# Patient Record
Sex: Male | Born: 1990 | Race: Black or African American | Hispanic: No | Marital: Single
Health system: Midwestern US, Community
[De-identification: ages and names within clinical notes are randomized; demographics above are authoritative.]

## PROBLEM LIST (undated history)

## (undated) DIAGNOSIS — M79602 Pain in left arm: Principal | ICD-10-CM

---
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
## 2010-02-28 ENCOUNTER — Emergency Department (HOSPITAL_COMMUNITY)
Admission: EM | Admit: 2010-02-28 | Discharge: 2010-02-28 | Payer: Self-pay | Source: Home / Self Care | Admitting: Family Medicine

## 2011-04-08 NOTE — ED Provider Notes (Signed)
MEDICATION ADMINISTRATION SUMMARY              Drug Name: Zofran, Dose Ordered: 4 mg, Route: IV Push, Status: Given,         Time: 18:37 04/08/2011,          Drug Name: Morphine Sulfate, Dose Ordered: 6 mg, Route: IV Push,         Status: Given, Time: 18:35 04/08/2011, Detailed record available in         Medication Service section.       KNOWN ALLERGIES   NKDA: Source: Patient       TRIAGE (Tue Apr 08, 2011 15:39 LDD0)   PATIENT: NAME: Benjamin Cannon, AGE: 73, GENDER: male, DOB: Sun         08-02-1990, TIME OF GREET: Tue Apr 08, 2011 15:35, [place]: [id number],         MEDICAL RECORD NUMBER: [id number], ACCOUNT NUMBER: [id number], PCP: Pt         Denies,. (Tue Apr 08, 2011 15:39 LDD0)   ADMISSION: URGENCY: 3, TRANSPORT: Ambulatory, DEPT: Emergency,         BED: WAITING. (Tue Apr 08, 2011 15:39 LDD0)   COMPLAINT:  Abd Pain LLQ. (16:01 LDD0)   PRESENTING COMPLAINT:  llq pain. (17:19 MTM1)   TB SCREENING: TB screen not applicable for this patient. (17:19         MTM1)   ABUSE SCREENING: Not Applicable. (17:19 MTM1)   FALL RISK: Fall risk assessment not applicable to this patient.         (17:19 MTM1)   SUICIDAL IDEATION: Not Applicable. (17:19 MTM1)   ADVANCE DIRECTIVES: Unknown if patient has advance directives.         (17:19 MTM1)   PROVIDERS: TRIAGE NURSE: Annamary Carolin, RN. (Tue Apr 08, 2011 15:39         LDD0)   PREVIOUS VISIT ALLERGIES: Nkda. (Tue Apr 08, 2011 15:39         LDD0)       PRESENTING PROBLEM (15:39 LDD0)      Presenting problems: Abdominal Pain.       CURRENT MEDICATIONS (16:01 LDD0)   Patient not taking meds       MEDICATION SERVICE   Morphine Sulfate:  Order: Morphine Sulfate - Dose: 6 mg         : IV Push         Ordered by: Mikal Plane, PA-C         Entered by: Mikal Plane, PA-C Tue Apr 08, 2011 17:57 ,          Acknowledged by: Montey Hora Julious Payer) Morido, RN Tue Apr 08, 2011 18:02         Documented as given by: Minta Balsam, RN, BSN Tue Apr 08, 2011         18:35           Patient, Medication, Dose, Route and Time verified prior to         administration.          Amount given: 6 mg, IV SITE #1 into left hand, IV SITE #1 IVP,         initial medication, Slowly, Connections checked prior to         administration, Line traced prior to administration, Catheter         placement confirmed via flush prior to administration, IV site         without signs or symptoms  of infiltration during medication         administration, No swelling during administration, No drainage during         administration, IV flushed after administration, Correct patient,         time, route, dose and medication confirmed prior to administration,         Patient advised of actions and side-effects prior to administration,         Allergies confirmed and medications reviewed prior to administration,         Patient in position of comfort, Side rails up, Cart in lowest         position, Family at bedside, Call light in reach.   Zofran:  Order: Zofran (Ondansetron Hydrochloride) -         Dose: 4 mg : IV Push         Ordered by: Mikal Plane, PA-C         Entered by: Mikal Plane, PA-C Tue Apr 08, 2011 17:57 ,          Acknowledged by: Montey Hora Julious Payer) Morido, RN Tue Apr 08, 2011 18:02         Documented as given by: Minta Balsam, RN, BSN Tue Apr 08, 2011         18:37          Patient, Medication, Dose, Route and Time verified prior to         administration.          Amount given: 4 mg, IV SITE #1 into left hand, IV SITE #1 IVP,         subsequent different medication, Slowly, Connections checked prior to         administration, Line traced prior to administration, Catheter         placement confirmed via flush prior to administration, IV site         without signs or symptoms of infiltration during medication         administration, No swelling during administration, No drainage during         administration, IV flushed after administration, Correct patient,          time, route, dose and medication confirmed prior to administration,         Patient advised of actions and side-effects prior to administration,         Allergies confirmed and medications reviewed prior to administration,         Patient in position of comfort, Side rails up, Cart in lowest         position, Call light in reach, pain-7.       ORDERS   BASIC METABOLIC PANEL:  Ordered for: Sherlon Handing, M.D., Christiane Ha         Status: Active. (17:57 JOHO)   Urine dip (send for lab U/A if positive):  Ordered for: Sherlon Handing,         M.D., Christiane Ha         Status: Done by Loralie Champagne, Mardecia Tue Apr 08, 2011 19:52.         (17:57 [facility])   IV- Saline Lock:  Ordered for: Sherlon Handing, M.D., Christiane Ha         Status: Done by Lindie Spruce, LPN, Luvenia Redden Apr 08, 2011 18:36. (17:57         [facility])   CT ABD &amp; PELVIS W/O CONTRAST:  Ordered for: Sherlon Handing, M.D.,         Christiane Ha  Status: Active. (18:01 JOHO)   ( blank )urine results please:  Ordered for: Sherlon Handing, M.D.,         Christiane Ha         Status: Cancelled by Lindie Spruce, LPN, Luvenia Redden Apr 08, 2011 19:38.         (19:19 JOHO)   ( blank )po fluids:  Ordered for: Sherlon Handing, M.D., Christiane Ha         Status: Done by Lindie Spruce, LPN, Luvenia Redden Apr 08, 2011 19:38. (19:34         [facility])   URINALYSIS:  Ordered for: Sherlon Handing, M.D., Christiane Ha         Status: Done by System Tue Apr 08, 2011 20:25. (19:55 NLW0)   URINE CULTURE:  Ordered for: Sherlon Handing, M.D., Christiane Ha         Status: Active. (20:43 JOHO)   Elita Boone IV Cath:  Ordered for: Sherlon Handing, M.D., Christiane Ha         Status: Active. (21:12 NLW0)   IV Start kit:  Ordered for: Sherlon Handing, M.D., Christiane Ha         Status: Active. (21:12 NLW0)       NURSING ASSESSMENT: ABDOMEN (17:19 MTM1)   CONSTITUTIONAL: Patient arrives ambulatory, Gait steady, Patient         cooperative, Patient alert, Oriented to person, place and time, Skin         warm, Skin dry, Skin normal in color, Mucous membranes pink, Mucous         membranes moist, Patient is well-groomed.    PAIN: aching pain, to the left lower quadrant, on a scale 0-10         patient rates pain as 6.   ABDOMEN: Abdomen assessment findings include abdomen symmetrical,         Abdomen soft, Bowel sound normal, no associated nausea, no associated         vomiting, no associated diarrhea, no associated constipation.   GENITOURINARY MALE: no associated urinary complaints.       NURSING PROCEDURE: DISCHARGE NOTE (21:12 NLW0)   DISCHARGE: Patient discharged to home, ambulating without         assistance, family driving, accompanied by husband/wife/partner,         Summary of Care printed/ provided, Discharge instructions given to         patient, Simple or moderate discharge teaching performed, by md,         Prescriptions given and instructions on side effects given, Name of         prescription(s) given: levaquin vicodin, Above person(s) verbalized         understanding of discharge instructions and follow-up care, Patient         treated and evaluated by physician, IV discontinued at 2110.   SAFETY: Side rails up, Cart/Stretcher in lowest position, Family         at bedside, Call light within reach, Hospital ID band on.       NURSING PROCEDURE: IV (18:36 NLW0)   PATIENT IDENITIFIER: Patient's identity verified by patient         stating name, Patient's identity verified by patient stating birth         date, Patient's identity verified by hospital ID bracelet.   IV SITE 1: IV established, to the left hand, using a 20 gauge         catheter, in one attempt, Saline lock established, Flushed with         normal saline (mls): 10,  Tourniquet removed from patient after         procedure., Notes: DONE BY RICA RN.   FOLLOW-UP SITE 1: After procedure, sterile transparent dressing         applied.   SAFETY: Side rails up, Cart/Stretcher in lowest position, Family         at bedside, Call light within reach, Hospital ID band on.       NURSING PROCEDURE: LAB DRAW   PATIENT IDENTIFIER: Patient's identity verified by patient          stating name, Patient's identity verified by patient stating birth         date, Patient's identity verified by hospital ID bracelet. (18:35         NLW0)     Patient's identity verified by patient stating name, Patient's identity         verified by patient stating birth date, Patient's identity verified         by hospital ID bracelet, Patient actively involved in identification         process. (19:14 JHL6)   LAB DRAW: Lab draw indicated for inability to obtain labs from IV         site, Subsequent lab draw performed, by venipuncture, from right         hand, in one attempt, Labs were drawn at 1830, Lab specimens labeled         in the presence of the patient and sent to lab, Tourniquet removed         from patient after procedure. (18:35 NLW0)     Lab draw indicated for obtaining specimens for evaluation, Initial lab         draw performed, by venipuncture, from right forearm, in one attempt,         Labs were drawn at 1910, Lab specimens labeled in the presence of the         patient and sent to lab, Tourniquet removed from patient after         procedure. (19:14 JHL6)   FOLLOW-UP: After procedure, dressing applied to site. (18:35         NLW0)     After procedure, dressing applied to site, After procedure, no swelling         at site, After procedure, no active bleeding from site. (19:14         JHL6)   SAFETY: Side rails up, Cart/Stretcher in lowest position, Family         at bedside, Call light within reach, Hospital ID band on. (18:35         NLW0)     Side rails up, Cart/Stretcher in lowest position, Call light within         reach, Hospital ID band on. (19:14 JHL6)       NURSING PROCEDURE: NURSE NOTES   NURSES NOTES: Notes: PT STATES HE WENT TO BATHROOM PRIOR TO CT         AND FORGOT TO GET URINE, STILL AWAITING PT TO URINATE AGAIN. (19:33         NLW0)     Patient is awaiting results, Patient is awaiting disposition. (20:30         NLW0)       NURSING PROCEDURE: TRANSPORT TO TESTS    PATIENT IDENTIFIER: Patient's identity verified by patient         stating name, Patient's identity verified by patient stating birth  date, Patient's identity verified by hospital ID bracelet. (18:41         DNW)   TRANSPORT TO TESTS: Patient transported to CT scan, via cart,         Hand-off report received from nicole. (18:41 DNW)     Patient transported to CT scan, via wheelchair, Accompanied by x-ray         technician. (18:43 CL8)       DIAGNOSIS (20:45 JOHO)   FINAL: PRIMARY: acute left lower quadrant abdominal pain,         ADDITIONAL: acute uti.       DISPOSITION   PATIENT:  Disposition Type: Discharged, Disposition: Discharge,         Condition: Stable. (20:45 [facility])      Patient left the department. (21:13 NLW0)       VITAL SIGNS   VITAL SIGNS: Pulse: 60, Resp: 20, Temp: 98.8 (Oral), Pain: 7, O2         sat: 100 on Room air, Time: 04/08/2011 15:59. (15:59 LDD0)     BP: 124/65 (Sitting), Time: 04/08/2011 16:01. (16:01 LDD0)     BP: 145/80 (Sitting), Pulse: 76, Resp: 18, Pain: 0, O2 sat: 100 on Room         air, Time: 04/08/2011 20:35. (20:35 NLW0)     BP: 140/81 (Sitting), Pulse: 78, Resp: 18, O2 sat: 99 on Room air, Time:         04/08/2011 18:05. (18:05 MTM1)       INSTRUCTION (20:48 JOHO)   DISCHARGE:  ABDOMINAL PAIN (CGH), URINARY TRACT INFECTION         (BLADDER INFECTION, UTI, CYSTITIS).   FOLLOWUPErnst Spell, GMD, [address], [place]         [address], [phone].   SPECIAL:  -rest         -increase fluid intake         -please call your doctor or Dr. Girtha Hake in am to schedule follow-up         appt for further eval of abdominal pain         -urine has been sent for culture.       PRESCRIPTION (20:46 JOHO)   Levaquin:  Tablet : 500 Mg : Oral : Quantity: *** 1 *** Unit:         tab(s) Route: Oral Schedule: once a day Dispense: *** 7 ***.   NOTES:  No refills          May use generic.   Vicodin:  Tablet : 500 mg-5 mg : Oral : Quantity: *** 1 *** Unit:          tab Route: Oral Schedule: Q6HPRN Dispense: *** 12 ***.   NOTES:  No refills          May use generic.   Key:     CL8=Lualhati, RN, BSN, [employer]  DNW=White, [employer], Deloris ([place])      JHL6=Holmes, ACT III, Marylu Lund     JOHO=Hubbard, PA-C, Amil Amen  LDD0=David, RN, Lyn  MTM1=Morido, RN, Melrico     (Rico)     NLW0=Wyatt, LPN, Joni Reining

## 2012-02-21 ENCOUNTER — Emergency Department (HOSPITAL_COMMUNITY): Payer: No Typology Code available for payment source

## 2012-02-21 ENCOUNTER — Emergency Department (HOSPITAL_COMMUNITY)
Admission: EM | Admit: 2012-02-21 | Discharge: 2012-02-21 | Disposition: A | Discharge status: Home / Self Care | Payer: No Typology Code available for payment source | Attending: Emergency Medicine | Admitting: Emergency Medicine

## 2012-02-21 ENCOUNTER — Encounter (HOSPITAL_COMMUNITY): Payer: Self-pay | Admitting: *Deleted

## 2012-02-21 DIAGNOSIS — R059 Cough, unspecified: Secondary | ICD-10-CM | POA: Insufficient documentation

## 2012-02-21 DIAGNOSIS — R000 Tachycardia, unspecified: Secondary | ICD-10-CM | POA: Insufficient documentation

## 2012-02-21 DIAGNOSIS — J069 Acute upper respiratory infection, unspecified: Secondary | ICD-10-CM

## 2012-02-21 DIAGNOSIS — F17200 Nicotine dependence, unspecified, uncomplicated: Secondary | ICD-10-CM | POA: Insufficient documentation

## 2012-02-21 DIAGNOSIS — R509 Fever, unspecified: Secondary | ICD-10-CM | POA: Insufficient documentation

## 2012-02-21 DIAGNOSIS — R0602 Shortness of breath: Secondary | ICD-10-CM | POA: Insufficient documentation

## 2012-02-21 DIAGNOSIS — R05 Cough: Secondary | ICD-10-CM | POA: Insufficient documentation

## 2012-02-21 MED ORDER — ALBUTEROL SULFATE HFA 108 (90 BASE) MCG/ACT IN AERS
2.0000 | INHALATION_SPRAY | RESPIRATORY_TRACT | Status: DC | PRN
  Administered 2012-02-21: 2 via RESPIRATORY_TRACT
  Filled 2012-02-21: qty 6.7

## 2012-02-21 MED ORDER — OXYCODONE-ACETAMINOPHEN 5-325 MG PO TABS: 1.0000 | ORAL_TABLET | Freq: Four times a day (QID) | ORAL | Status: DC | PRN

## 2012-02-21 NOTE — ED Notes (Signed)
Pt ambulatory leaving ED by self; Pt leaving with inhaler, prescription, and d/c paperwork. Pt has no further questions upon d/c. Pt does not appear to be in any acute distress upon d/c.

## 2012-02-21 NOTE — ED Notes (Signed)
Pt states chest pain and shortness of breath increases in cold weather; pt states not so much pain as "funny feeling." Pt mentating appropriately. Pt states numbness and tingling in legs but "associates that with work." Pt denies blurred vision. Pt denies nausea and vomiting.

## 2012-02-21 NOTE — ED Provider Notes (Signed)
History   This chart was scribed for [employer]. Rubin Payor, MD by Charolett Bumpers, ED Scribe. The patient was seen in room TR08C/TR08C. Patient's care was started at 1632.   CSN: [id number]  Arrival date & time 02/21/12  1554   First MD Initiated Contact with Patient 02/21/12 1632      Chief Complaint  Patient presents with  . Chest Pain  . Shortness of Breath  . Cough    The history is provided by the patient. No language interpreter was used.   Mark Johnston is a 21 y.o. male who presents to the Emergency Department complaining of intermittent, moderate chest pain with associated SOB and a productive cough with sputum for the past few days. He denies any h/o asthma. He also reports a slight fever yesterday and one episode of blood tinged sputum. Temperature here in ED is 98.4. He states that he is otherwise healthy. He smokes occasionally. He denies any nausea, vomiting, diarrhea, body aches, leg swelling.    History reviewed. No pertinent past medical history.  History reviewed. No pertinent past surgical history.  No family history on file.  History  Substance Use Topics  . Smoking status: Current Every Day Smoker  . Smokeless tobacco: Not on file  . Alcohol Use: Yes      Review of Systems  Constitutional: Positive for fever.  Respiratory: Positive for cough and shortness of breath.   Cardiovascular: Positive for chest pain. Negative for leg swelling.  Gastrointestinal: Negative for nausea, vomiting and diarrhea.  Musculoskeletal: Negative for myalgias.  All other systems reviewed and are negative.    Allergies  Review of patient's allergies indicates no known allergies.  Home Medications   Current Outpatient Rx  Name  Route  Sig  Dispense  Refill  . NYQUIL PO   Oral   Take 10 mLs by mouth daily as needed. For cold and flu symptoms         . OXYCODONE-ACETAMINOPHEN 5-325 MG PO TABS   Oral   Take 1-2 tablets by mouth every 6 (six) hours as needed  for pain.   10 tablet   0     BP 131/86  Pulse 91  Temp 98.4 F (36.9 C) (Oral)  Resp 18  Ht 5\' 8"  (1.727 m)  Wt 200 lb (90.719 kg)  BMI 30.41 kg/m2  SpO2 100%  Physical Exam  Nursing note and vitals reviewed. Constitutional: He is oriented to person, place, and time. He appears well-developed and well-nourished. No distress.  HENT:  Head: Normocephalic and atraumatic.  Mouth/Throat: Oropharynx is clear and moist. No oropharyngeal exudate.  Eyes: EOM are normal.  Neck: Neck supple. No tracheal deviation present.  Cardiovascular: Regular rhythm and normal heart sounds.  Tachycardia present.   No murmur heard.      Mild tachycardia.   Pulmonary/Chest: Effort normal and breath sounds normal. No respiratory distress.  Abdominal: Soft. There is no tenderness.  Musculoskeletal: Normal range of motion. He exhibits no edema.       No peripheral edema.   Neurological: He is alert and oriented to person, place, and time.  Skin: Skin is warm and dry.  Psychiatric: He has a normal mood and affect. His behavior is normal.    ED Course  Procedures (including critical care time)  COORDINATION OF CARE:  16:54-Discussed planned course of treatment with the patient, who is agreeable at this time.     Labs Reviewed - No data to display Dg Chest 2 View  02/21/2012  *RADIOLOGY REPORT*  Clinical Data: Chest pain  CHEST - 2 VIEW  Comparison: None.  Findings: Normal mediastinum and cardiac silhouette.  Normal pulmonary  vasculature.  No evidence of effusion, infiltrate, or pneumothorax.  No acute bony abnormality.  IMPRESSION: No acute cardiopulmonary process.   Original Report Authenticated By: Genevive Bi, M.D.      1. URI (upper respiratory infection)       MDM  Patient with URI symptoms of viral syndrome. Had minimal hemoptysis. That has resolved. He's had some myalgias. No focal findings on auscultation an x-ray does not show pneumonia. Patient be discharged home with  symptomatic treatment.   I personally performed the services described in this documentation, which was scribed in my presence. The recorded information has been reviewed and is accurate.  e     Juliet Rude. Rubin Payor, MD 02/21/12 [phone]

## 2012-02-21 NOTE — ED Notes (Signed)
Dr Pickering at bedside 

## 2012-02-21 NOTE — ED Notes (Signed)
Patient reports he has had interemittent chest pain before christmas,  His sx have increased since with cough/congestion and shortness of breath.  He has tried otc treatment w/o relief.   Patient unsure if he has had a fever

## 2013-03-19 ENCOUNTER — Emergency Department (HOSPITAL_COMMUNITY)
Admission: EM | Admit: 2013-03-19 | Discharge: 2013-03-19 | Disposition: A | Discharge status: Home / Self Care | Payer: No Typology Code available for payment source | Attending: Emergency Medicine | Admitting: Emergency Medicine

## 2013-03-19 ENCOUNTER — Encounter (HOSPITAL_COMMUNITY): Payer: Self-pay | Admitting: Emergency Medicine

## 2013-03-19 ENCOUNTER — Emergency Department (HOSPITAL_COMMUNITY): Payer: No Typology Code available for payment source

## 2013-03-19 DIAGNOSIS — F17200 Nicotine dependence, unspecified, uncomplicated: Secondary | ICD-10-CM | POA: Insufficient documentation

## 2013-03-19 DIAGNOSIS — R079 Chest pain, unspecified: Secondary | ICD-10-CM | POA: Insufficient documentation

## 2013-03-19 LAB — CBC
HEMATOCRIT: 41.3 % (ref 39.0–52.0)
HEMOGLOBIN: 14.9 g/dL (ref 13.0–17.0)
MCH: 29.3 pg (ref 26.0–34.0)
MCHC: 36.1 g/dL — ABNORMAL HIGH (ref 30.0–36.0)
MCV: 81.3 fL (ref 78.0–100.0)
Platelets: 334 10*3/uL (ref 150–400)
RBC: 5.08 MIL/uL (ref 4.22–5.81)
RDW: 12 % (ref 11.5–15.5)
WBC: 7 10*3/uL (ref 4.0–10.5)

## 2013-03-19 LAB — BASIC METABOLIC PANEL
BUN: 10 mg/dL (ref 6–23)
CALCIUM: 9.7 mg/dL (ref 8.4–10.5)
CO2: 26 meq/L (ref 19–32)
CREATININE: 0.98 mg/dL (ref 0.50–1.35)
Chloride: 101 mEq/L (ref 96–112)
GFR calc Af Amer: >90 mL/min (ref >90)
GFR calc non Af Amer: >90 mL/min (ref >90)
GLUCOSE: 86 mg/dL (ref 70–99)
Potassium: 4.2 mEq/L (ref 3.7–5.3)
SODIUM: 140 meq/L (ref 137–147)

## 2013-03-19 LAB — TROPONIN I: Troponin I: <0.3 ng/mL (ref <0.30)

## 2013-03-19 MED ORDER — IBUPROFEN 800 MG PO TABS: 800.0000 mg | ORAL_TABLET | Freq: Three times a day (TID) | ORAL | Status: DC

## 2013-03-19 NOTE — Discharge Instructions (Signed)
Chest Pain (Nonspecific) °It is often hard to give a specific diagnosis for the cause of chest pain. There is always a chance that your pain could be related to something serious, such as a heart attack or a blood clot in the lungs. You need to follow up with your caregiver for further evaluation. °CAUSES  °· Heartburn. °· Pneumonia or bronchitis. °· Anxiety or stress. °· Inflammation around your heart (pericarditis) or lung (pleuritis or pleurisy). °· A blood clot in the lung. °· A collapsed lung (pneumothorax). It can develop suddenly on its own (spontaneous pneumothorax) or from injury (trauma) to the chest. °· Shingles infection (herpes zoster virus). °The chest wall is composed of bones, muscles, and cartilage. Any of these can be the source of the pain. °· The bones can be bruised by injury. °· The muscles or cartilage can be strained by coughing or overwork. °· The cartilage can be affected by inflammation and become sore (costochondritis). °DIAGNOSIS  °Lab tests or other studies, such as X-rays, electrocardiography, stress testing, or cardiac imaging, may be needed to find the cause of your pain.  °TREATMENT  °· Treatment depends on what may be causing your chest pain. Treatment may include: °· Acid blockers for heartburn. °· Anti-inflammatory medicine. °· Pain medicine for inflammatory conditions. °· Antibiotics if an infection is present. °· You may be advised to change lifestyle habits. This includes stopping smoking and avoiding alcohol, caffeine, and chocolate. °· You may be advised to keep your head raised (elevated) when sleeping. This reduces the chance of acid going backward from your stomach into your esophagus. °· Most of the time, nonspecific chest pain will improve within 2 to 3 days with rest and mild pain medicine. °HOME CARE INSTRUCTIONS  °· If antibiotics were prescribed, take your antibiotics as directed. Finish them even if you start to feel better. °· For the next few days, avoid physical  activities that bring on chest pain. Continue physical activities as directed. °· Do not smoke. °· Avoid drinking alcohol. °· Only take over-the-counter or prescription medicine for pain, discomfort, or fever as directed by your caregiver. °· Follow your caregiver's suggestions for further testing if your chest pain does not go away. °· Keep any follow-up appointments you made. If you do not go to an appointment, you could develop lasting (chronic) problems with pain. If there is any problem keeping an appointment, you must call to reschedule. °SEEK MEDICAL CARE IF:  °· You think you are having problems from the medicine you are taking. Read your medicine instructions carefully. °· Your chest pain does not go away, even after treatment. °· You develop a rash with blisters on your chest. °SEEK IMMEDIATE MEDICAL CARE IF:  °· You have increased chest pain or pain that spreads to your arm, neck, jaw, back, or abdomen. °· You develop shortness of breath, an increasing cough, or you are coughing up blood. °· You have severe back or abdominal pain, feel nauseous, or vomit. °· You develop severe weakness, fainting, or chills. °· You have a fever. °THIS IS AN EMERGENCY. Do not wait to see if the pain will go away. Get medical help at once. Call your local emergency services (911 in [place] Do not drive yourself to the hospital. °MAKE SURE YOU:  °· Understand these instructions. °· Will watch your condition. °· Will get help right away if you are not doing well or get worse. °Document Released: 11/20/2004 Document Revised: 05/05/2011 Document Reviewed: 09/16/2007 °ExitCare® Patient Information ©2014 [employer],   LLC. ° ° ° °Emergency Department Resource Guide °1) Find a Doctor and Pay Out of Pocket °Although you won't have to find out who is covered by your insurance plan, it is a good idea to ask around and get recommendations. You will then need to call the office and see if the doctor you have chosen will accept you as a new  patient and what types of options they offer for patients who are self-pay. Some doctors offer discounts or will set up payment plans for their patients who do not have insurance, but you will need to ask so you aren't surprised when you get to your appointment. ° °2) Contact Your Local Health Department °Not all health departments have doctors that can see patients for sick visits, but many do, so it is worth a call to see if yours does. If you don't know where your local health department is, you can check in your phone book. The CDC also has a tool to help you locate your state's health department, and many state websites also have listings of all of their local health departments. ° °3) Find a Walk-in Clinic °If your illness is not likely to be very severe or complicated, you may want to try a walk in clinic. These are popping up all over the country in pharmacies, drugstores, and shopping centers. They're usually staffed by nurse practitioners or physician assistants that have been trained to treat common illnesses and complaints. They're usually fairly quick and inexpensive. However, if you have serious medical issues or chronic medical problems, these are probably not your best option. ° °No Primary Care Doctor: °- Call [facility] at  832-8000 - they can help you locate a primary care doctor that  accepts your insurance, provides certain services, etc. °- Physician Referral Service- 1-800-533-3463 ° °Chronic Pain Problems: °Organization         Address  Phone   Notes  °[facility]  (336) 297-2271 Patients need to be referred by their primary care doctor.  ° °Medication Assistance: °Organization         Address  Phone   Notes  °[place] [facility] [address] °[place], [place] [address] (336) 641-8030 --Must be a resident of [place] °-- Must have NO insurance coverage whatsoever (no Medicaid/ Medicare, etc.) °-- The pt. MUST have a primary  care doctor that directs their care regularly and follows them in the community °  °[facility]  (866) 331-1348   °[employer]  (888) 892-1162   ° °Agencies that provide inexpensive medical care: °Organization         Address  Phone   Notes  °[facility]  (336) 832-8035   °[facility]    (336) 832-7272   °[facility] [address] °[place], [place] [address] (336) 832-4777   °[facility] [address], °[place] (336) 271-4999   °[facility]    (336) 373-0678   °[facility]    (336) 272-1050   °[facility] ° [address], [place] Phone:  (336) 832-4444, Fax:  (336) 832-4440 Hours of Operation:  9 am - 6 pm, M-F.  Also accepts Medicaid/Medicare and self-pay.  °[facility] ° [address], [place] Phone: (336) 832-3150, Fax: (336) [phone]. Hours of Operation:  8:30 am - 5:30 pm, M-F.  Also accepts Medicaid and self-pay.  °  [facility] [place] [address], [place] Phone: (336) 878-6027   °[facility] [address], [place], [place] (336)723-1848, Ext. 123 Mondays & Thursdays: 7-9 AM.  First 15 patients are seen on a first come, first serve basis. °  ° °Medicaid-accepting [place] Providers: ° °Organization         Address  Phone   Notes  °[facility] [address], [place] (336) 641-2100 Also accepts self-pay patients.  °[facility] [address], [place] ° (336) 856-9996   °[facility] [address], [place] (336) 288-8857   °[facility] [address] [place] [address], [place] (336) 299-7000   °Veita Bland [address], [place]  ° (336) 373-1557 Only accepts [place] Access Medicaid patients after they have their name applied to their card.  ° °Self-Pay (no insurance) in [place]: ° °Organization         Address  Phone   Notes  °Sickle Cell Patients, [facility] [address], [place] (336) 832-1970   °[facility] [address], [place] (336) 832-4400   °[facility] ° [address],  (336) 992-4800   °[facility]/Dr. Osei-Bonsu ° [address] [place] [address], [place] or [address], [place] (336) [phone] Phone number for both [place] and [place] locations is the same.  °[facility] [address], [place] (336) 299-0000   °[facility] [address] [place] [address], [place] or [address] (336) 852-7530 °(336) 878-2260   °[facility] [address], [place] (336) 350-1642, phone; (336) 294-5005, fax Sees patients 1st and 3rd Saturday of every month.  Must not qualify for public or private insurance (i.e. Medicaid, Medicare, [place] Health Choice, Veterans' Benefits) • Household income should be no more than 200% of the poverty level •The clinic cannot treat you if you are pregnant or think you are pregnant • Sexually transmitted diseases are not treated at the clinic.  ° ° °Dental Care: °Organization         Address  Phone  Notes  °[facility] [facility] [address], [place] (336) 641-6152 Accepts children up to age 21 who are enrolled in Medicaid or [place] Health Choice; pregnant women with a Medicaid card; and children who have applied for Medicaid or [place] Health Choice, but were declined, whose parents can pay a reduced fee at time of service.  °[facility] [place]  [address], [place] (336) 641-7733 Accepts children up to age 21 who are enrolled in Medicaid or [place] Health Choice; pregnant women with a Medicaid card; and children who have applied for Medicaid or [place] Health Choice, but were declined, whose parents can  pay a reduced fee at time of service.  °[facility] ° [address],  (336) 641-4533 Patients are seen by appointment only. Walk-ins are not accepted. [facility] will see patients 18 years of age and older. °Monday - Tuesday (8am-5pm) °Most Wednesdays (8:30-5pm) °$30 per visit, cash only  °[facility] ° [address], [place] (336) 641-4533 Patients are seen by appointment only. Walk-ins are not accepted. [facility] will see patients 18 years of   age and older. °One Wednesday Evening (Monthly: Volunteer Based).  $30 per visit, cash only  °[facility]  (919) 537-3737 for adults; Children under age 4, call Graduate Pediatric Dentistry at ([phone]) [phone]. Children aged 4-14, please call ([phone]) [phone] to request a pediatric application. ° Dental services are provided in all areas of dental care including fillings, crowns and bridges, complete and partial dentures, implants, gum treatment, root canals, and extractions. Preventive care is also provided. Treatment is provided to both adults and children. °Patients are selected via a lottery and there is often a waiting list. °  °[facility] [address], °[place] ° (336) 763-8833 [email or web address] °  °[facility] [address], [place], [place] (336)723-1848, Ext. 123 Second and Fourth Thursday of each month, opens at 6:30 AM; Clinic ends at 9 AM.  Patients are seen on a first-come first-served basis, and a limited number are seen during each clinic.  ° °[facility] ° [address], [place], [place] (336) 723-7904   Eligibility Requirements °You must have lived in [place], or [place] counties for at least the last three months. °  You cannot be eligible for state or federal sponsored healthcare insurance, including Veterans Administration, Medicaid, or Medicare. °  You generally cannot be eligible for healthcare  insurance through your employer.  °  How to apply: °Eligibility screenings are held every Tuesday and Wednesday afternoon from 1:00 pm until 4:00 pm. You do not need an appointment for the interview!  °[facility] [address], [place], [place] 336-631-2330   °[facility]  336-342-8273   °[facility]  336-703-3100   °[facility]  336-570-6415   ° °Behavioral Health Resources in the Community: °Intensive Outpatient Programs °Organization         Address  Phone  Notes  °[facility] [address], [place], [place] 336-878-6098   °[facility] [address], [place], [place] 336-832-9800   °[facility] [address], [place], [place] ° 336-882-2125   °[facility] [address],  °[place], [place] 1-800-853-5163 or 336-641-4981   °Substance Abuse Resources °Organization         Address  Phone  Notes  °[facility]  336-882-2125   °[facility]  336-784-9470   °[facility]  336-285-9073   °[facility]  336-845-3988   °Residential & Outpatient Substance Abuse Program  1-800-659-3381   °Psychological Services °Organization         Address  Phone  Notes  °[facility]  336- 832-9600   °[facility]  336- 378-7881   °[facility] [address], [place] 1-800-853-5163 or 336-641-4981   ° °Mobile Crisis Teams °Organization         Address  Phone  Notes  °[facility], Mobile Crisis Care Unit  1-877-626-1772   °Assertive °[facility] ° [address] [place], [place] 336-834-9664   °Sharon DeEsch [address] °[place] 336-554-5454   ° °Self-Help/Support Groups °Organization         Address  Phone             Notes  °[facility] - variety of support groups  336- 373-1402 Call for more information  °Narcotics Anonymous (NA),  [facility] [address], °[place]  2   meetings at this location  ° °Residential Treatment Programs °Organization         Address  Phone  Notes  °[facility] [address],    °[place]  1-866-801-8205   °[facility] ° [address], [place], [place] 704-293-8524   °[facility] [address], [place] 336-845-3988 Admissions: 8am-3pm M-F  °[facility] [address]    °[place], [place] 336-841-1104   °[facility] [address], [place], [place] 336-379-7146   °[facility] [address]  °[place], [place] 336-285-9073   °[facility] - Intensive Outpatient [address], [place], [place] 336-852-3033   °[facility] [address]  °[place], [place] 1-877-615-2722 or 336-784-9470   °[facility] ([facility] [address] [place], [place] 336-227-7417 Accepts Medicaid  °[facility] [address]  °[place] 1-800-659-3381 Substance Abuse/Addiction Treatment  ° °[place] Behavioral Health Resources °Organization         Address  Phone  Notes  °[facility]  (888) 581-9988   °Julie Brannon, PhD [address] [place], [place]   (336) 349-5553 or (336) 951-0000   °[facility]   [address] °[place], [place] (336) 349-4454   °[facility] [address], [place], [place] (336) 342-8316 Insurance/Medicaid/sponsorship through [facility]  °[facility] [address]                                    [place], [place] (336) 342-8316 Therapy/tele-psych/case  °[facility] [address]  ° [place], [place] (336) 349-2233    °Dr. Arfeen  (336) 349-4544   °[facility] [place]  [employer] [place] [facility] 1) [address], [place] °2) [address], [place] °3)  [address] [place] [address], [place] (336) 349-3220 °(336) 342-7768 ° °(336) 342-8140    °[place] [facility] (336) 342-1394 or (336) 342-3537 (After Hours)    ° ° ° °

## 2013-03-19 NOTE — ED Notes (Signed)
PT reports CP comes and goes . Pt reports cold air makes CP worse.

## 2013-03-19 NOTE — ED Provider Notes (Signed)
CSN: [id number]     Arrival date & time 03/19/13  1113 History   First MD Initiated Contact with Patient 03/19/13 1122     Chief Complaint  Patient presents with  . Chest Pain   (Consider location/radiation/quality/duration/timing/severity/associated sxs/prior Treatment) Patient is a 24 y.o. male presenting with chest pain. The history is provided by the patient.  Chest Pain Pain location:  R chest Pain quality: sharp   Pain radiates to:  Does not radiate Pain radiates to the back: no   Pain severity:  Mild Onset quality:  Gradual Duration: 2-3 seconds at a time over past 3 days. Occasional, every 10-20 minutes or so. Timing:  Intermittent Progression:  Unchanged Chronicity:  New Context: at rest   Context: not breathing, not eating, no intercourse, no movement, no stress and no trauma   Relieved by:  Nothing Worsened by:  Nothing tried Associated symptoms: no abdominal pain, no cough, no fever, no shortness of breath and not vomiting     History reviewed. No pertinent past medical history. History reviewed. No pertinent past surgical history. History reviewed. No pertinent family history. History  Substance Use Topics  . Smoking status: Current Every Day Smoker  . Smokeless tobacco: Not on file  . Alcohol Use: Yes    Review of Systems  Constitutional: Negative for fever.  Respiratory: Negative for cough and shortness of breath.   Cardiovascular: Positive for chest pain. Negative for leg swelling.  Gastrointestinal: Negative for vomiting and abdominal pain.  All other systems reviewed and are negative.    Allergies  Review of patient's allergies indicates no known allergies.  Home Medications  No current outpatient prescriptions on file. BP 121/69  Pulse 85  Temp(Src) 97.7 F (36.5 C) (Oral)  Resp 16  Ht 5\' 7"  (1.702 m)  Wt 230 lb 9.6 oz (104.599 kg)  BMI 36.11 kg/m2  SpO2 98% Physical Exam  Nursing note and vitals reviewed. Constitutional: He is oriented  to person, place, and time. He appears well-developed and well-nourished. No distress.  HENT:  Head: Normocephalic and atraumatic.  Mouth/Throat: No oropharyngeal exudate.  Eyes: EOM are normal. Pupils are equal, round, and reactive to light.  Neck: Normal range of motion. Neck supple.  Cardiovascular: Normal rate and regular rhythm.  Exam reveals no friction rub.   No murmur heard. Pulmonary/Chest: Effort normal and breath sounds normal. No respiratory distress. He has no wheezes. He has no rales.  Abdominal: He exhibits no distension. There is no tenderness. There is no rebound.  Musculoskeletal: Normal range of motion. He exhibits no edema.  Neurological: He is alert and oriented to person, place, and time.  Skin: He is not diaphoretic.    ED Course  Procedures (including critical care time) Labs Review Labs Reviewed  CBC - Abnormal; Notable for the following:    MCHC 36.1 (*)    All other components within normal limits  BASIC METABOLIC PANEL  TROPONIN I   Imaging Review Dg Chest 2 View  03/19/2013   CLINICAL DATA:  Chest pain  EXAM: CHEST  2 VIEW  COMPARISON:  02/19/2012  FINDINGS: Normal heart size. Clear lungs. Stable mild bronchitic changes. No pneumothorax.  IMPRESSION: No active cardiopulmonary disease.   Electronically Signed   By: Maryclare Bean M.D.   On: 03/19/2013 13:23    EKG Interpretation    Date/Time:  Saturday March 19 2013 11:19:19 EST Ventricular Rate:  83 PR Interval:  170 QRS Duration: 94 QT Interval:  346 QTC Calculation: 406  R Axis:   42 Text Interpretation:  Normal sinus rhythm with sinus arrhythmia Nonspecific ST and T wave abnormality Similar to prior Confirmed by [facility]  [facility], Chanti Golubski ([id number]) on 03/19/2013 11:39:00 AM            MDM   1. Chest pain    23 year old male here with chest pain. Right-sided, sharp. His nonradiating. Last 2-3 seconds. It is not pleuritic. He is unsure of the origin. There are no alleviating or exacerbating factors.  He has not been recently sick with coughing. He is also concerned about a lower abdominal bulging sensation is parenchyma the past year. He is not this today. He denies any fever, shortness of breath, hemoptysis. He is PERC negative. He has stable vital signs here exam is benign. His left inguinal canal does have small defect concerning for possible intermittent hernia. I instructed him he can follow up with his regular Dr. for his hernia and abdominal pain that appears chronic and is not present today. We'll check chest x-ray, labs. His EKG here is normal. Labs normal. CXR normal. Discharged with motrin and PCP resource guide.     Dagmar HaitWilliam Larysa Pall, MD 03/19/13 1415

## 2013-03-19 NOTE — ED Notes (Signed)
Pt states hes had CP for past 3 days. He also states he has had a painful sensation in lower abd for past 4 years that no one has been able to diagnose. He states that pain started 4 years ago after he urinated a small amount of blood and has been intermittent ever since.

## 2013-08-01 ENCOUNTER — Emergency Department (HOSPITAL_COMMUNITY)
Admission: EM | Admit: 2013-08-01 | Discharge: 2013-08-01 | Disposition: A | Discharge status: Home / Self Care | Payer: No Typology Code available for payment source | Attending: Emergency Medicine | Admitting: Emergency Medicine

## 2013-08-01 ENCOUNTER — Emergency Department (HOSPITAL_COMMUNITY): Payer: No Typology Code available for payment source

## 2013-08-01 ENCOUNTER — Encounter (HOSPITAL_COMMUNITY): Payer: Self-pay | Admitting: Emergency Medicine

## 2013-08-01 DIAGNOSIS — R202 Paresthesia of skin: Secondary | ICD-10-CM

## 2013-08-01 DIAGNOSIS — R51 Headache: Secondary | ICD-10-CM | POA: Insufficient documentation

## 2013-08-01 DIAGNOSIS — Z791 Long term (current) use of non-steroidal anti-inflammatories (NSAID): Secondary | ICD-10-CM | POA: Insufficient documentation

## 2013-08-01 DIAGNOSIS — M542 Cervicalgia: Secondary | ICD-10-CM | POA: Insufficient documentation

## 2013-08-01 DIAGNOSIS — F17200 Nicotine dependence, unspecified, uncomplicated: Secondary | ICD-10-CM | POA: Insufficient documentation

## 2013-08-01 DIAGNOSIS — R519 Headache, unspecified: Secondary | ICD-10-CM

## 2013-08-01 DIAGNOSIS — R209 Unspecified disturbances of skin sensation: Secondary | ICD-10-CM | POA: Insufficient documentation

## 2013-08-01 LAB — COMPREHENSIVE METABOLIC PANEL
ALT: 33 U/L (ref 0–53)
AST: 22 U/L (ref 0–37)
Albumin: 4.3 g/dL (ref 3.5–5.2)
Alkaline Phosphatase: 57 U/L (ref 39–117)
BUN: 10 mg/dL (ref 6–23)
CALCIUM: 10.2 mg/dL (ref 8.4–10.5)
CO2: 27 meq/L (ref 19–32)
Chloride: 100 mEq/L (ref 96–112)
Creatinine, Ser: 1.02 mg/dL (ref 0.50–1.35)
GLUCOSE: 93 mg/dL (ref 70–99)
Potassium: 4.4 mEq/L (ref 3.7–5.3)
SODIUM: 140 meq/L (ref 137–147)
Total Bilirubin: 0.2 mg/dL — ABNORMAL LOW (ref 0.3–1.2)
Total Protein: 8 g/dL (ref 6.0–8.3)

## 2013-08-01 LAB — CBC
HCT: 43.4 % (ref 39.0–52.0)
HEMOGLOBIN: 15.1 g/dL (ref 13.0–17.0)
MCH: 29.5 pg (ref 26.0–34.0)
MCHC: 34.8 g/dL (ref 30.0–36.0)
MCV: 84.9 fL (ref 78.0–100.0)
Platelets: 298 10*3/uL (ref 150–400)
RBC: 5.11 MIL/uL (ref 4.22–5.81)
RDW: 12.3 % (ref 11.5–15.5)
WBC: 6.1 10*3/uL (ref 4.0–10.5)

## 2013-08-01 NOTE — ED Provider Notes (Signed)
CSN: [id number]     Arrival date & time 08/01/13  1217 History   First MD Initiated Contact with Patient 08/01/13 1617     Chief Complaint  Patient presents with  . Tingling     (Consider location/radiation/quality/duration/timing/severity/associated sxs/prior Treatment) The history is provided by the patient.   23 year old male presents with a complaint of intermittent left arm tingling yesterday had a headache now resolved last night had some nausea now resolved. Patient also has had a history of with exertion gets a lightening-like shooting pain in the occiput of his head and down to his neck is quite severe and very brief almost like electrical kind of pain. This is been going on for a number of years.  History reviewed. No pertinent past medical history. History reviewed. No pertinent past surgical history. History reviewed. No pertinent family history. History  Substance Use Topics  . Smoking status: Current Every Day Smoker -- 0.50 packs/day  . Smokeless tobacco: Not on file  . Alcohol Use: Yes    Review of Systems  Constitutional: Negative for fever.  HENT: Negative for congestion.   Eyes: Negative for visual disturbance.  Respiratory: Negative for shortness of breath.   Cardiovascular: Negative for chest pain.  Genitourinary: Negative for dysuria.  Musculoskeletal: Positive for neck pain. Negative for back pain.  Skin: Negative for rash.  Neurological: Positive for numbness and headaches.  Hematological: Does not bruise/bleed easily.  Psychiatric/Behavioral: Negative for confusion.      Allergies  Review of patient's allergies indicates no known allergies.  Home Medications   Prior to Admission medications   Medication Sig Start Date End Date Taking? Authorizing Provider  ibuprofen (ADVIL,MOTRIN) 800 MG tablet Take 1 tablet (800 mg total) by mouth 3 (three) times daily. 03/19/13   Dagmar Hait, MD   BP 120/52  Pulse 63  Temp(Src) 98.2 F (36.8 C)  (Oral)  Resp 15  Ht 5\' 7"  (1.702 m)  Wt 229 lb (103.874 kg)  BMI 35.86 kg/m2  SpO2 99% Physical Exam  Nursing note and vitals reviewed. Constitutional: He is oriented to person, place, and time. He appears well-developed and well-nourished. No distress.  HENT:  Head: Normocephalic and atraumatic.  Mouth/Throat: Oropharynx is clear and moist.  Eyes: Conjunctivae and EOM are normal.  Neck: Normal range of motion.  Cardiovascular: Normal rate, regular rhythm and normal heart sounds.   No murmur heard. Pulmonary/Chest: Effort normal and breath sounds normal.  Abdominal: Soft. Bowel sounds are normal. There is no tenderness.  Musculoskeletal: Normal range of motion. He exhibits no edema and no tenderness.  Neurological: He is alert and oriented to person, place, and time. No cranial nerve deficit. He exhibits normal muscle tone. Coordination normal.  Skin: Skin is warm. No rash noted.    ED Course  Procedures (including critical care time) Labs Review Labs Reviewed  COMPREHENSIVE METABOLIC PANEL - Abnormal; Notable for the following:    Total Bilirubin 0.2 (*)    All other components within normal limits  CBC   Results for orders placed during the hospital encounter of 08/01/13  COMPREHENSIVE METABOLIC PANEL      Result Value Ref Range   Sodium 140  137 - 147 mEq/L   Potassium 4.4  3.7 - 5.3 mEq/L   Chloride 100  96 - 112 mEq/L   CO2 27  19 - 32 mEq/L   Glucose, Bld 93  70 - 99 mg/dL   BUN 10  6 - 23 mg/dL   Creatinine,  Ser 1.02  0.50 - 1.35 mg/dL   Calcium 96.010.2  8.4 - 45.410.5 mg/dL   Total Protein 8.0  6.0 - 8.3 g/dL   Albumin 4.3  3.5 - 5.2 g/dL   AST 22  0 - 37 U/L   ALT 33  0 - 53 U/L   Alkaline Phosphatase 57  39 - 117 U/L   Total Bilirubin 0.2 (*) 0.3 - 1.2 mg/dL   GFR calc non Af Amer >90  >90 mL/min   GFR calc Af Amer >90  >90 mL/min  CBC      Result Value Ref Range   WBC 6.1  4.0 - 10.5 K/uL   RBC 5.11  4.22 - 5.81 MIL/uL   Hemoglobin 15.1  13.0 - 17.0 g/dL    HCT 09.843.4  11.939.0 - 14.752.0 %   MCV 84.9  78.0 - 100.0 fL   MCH 29.5  26.0 - 34.0 pg   MCHC 34.8  30.0 - 36.0 g/dL   RDW 82.912.3  56.211.5 - 13.015.5 %   Platelets 298  150 - 400 K/uL   Results for orders placed during the hospital encounter of 08/01/13  COMPREHENSIVE METABOLIC PANEL      Result Value Ref Range   Sodium 140  137 - 147 mEq/L   Potassium 4.4  3.7 - 5.3 mEq/L   Chloride 100  96 - 112 mEq/L   CO2 27  19 - 32 mEq/L   Glucose, Bld 93  70 - 99 mg/dL   BUN 10  6 - 23 mg/dL   Creatinine, Ser 8.651.02  0.50 - 1.35 mg/dL   Calcium 78.410.2  8.4 - 69.610.5 mg/dL   Total Protein 8.0  6.0 - 8.3 g/dL   Albumin 4.3  3.5 - 5.2 g/dL   AST 22  0 - 37 U/L   ALT 33  0 - 53 U/L   Alkaline Phosphatase 57  39 - 117 U/L   Total Bilirubin 0.2 (*) 0.3 - 1.2 mg/dL   GFR calc non Af Amer >90  >90 mL/min   GFR calc Af Amer >90  >90 mL/min  CBC      Result Value Ref Range   WBC 6.1  4.0 - 10.5 K/uL   RBC 5.11  4.22 - 5.81 MIL/uL   Hemoglobin 15.1  13.0 - 17.0 g/dL   HCT 29.543.4  28.439.0 - 13.252.0 %   MCV 84.9  78.0 - 100.0 fL   MCH 29.5  26.0 - 34.0 pg   MCHC 34.8  30.0 - 36.0 g/dL   RDW 44.012.3  10.211.5 - 72.515.5 %   Platelets 298  150 - 400 K/uL   Ct Head Wo Contrast  08/01/2013   CLINICAL DATA:  Left arm tingling and headache.  Nausea.  EXAM: CT HEAD WITHOUT CONTRAST  CT CERVICAL SPINE WITHOUT CONTRAST  TECHNIQUE: Multidetector CT imaging of the head and cervical spine was performed following the standard protocol without intravenous contrast. Multiplanar CT image reconstructions of the cervical spine were also generated.  COMPARISON:  None.  FINDINGS: CT HEAD FINDINGS  No evidence of an acute infarct, acute hemorrhage, mass lesion, mass effect or hydrocephalus. No air-fluid levels in the visualized portions of the paranasal sinuses and mastoid air cells.  CT CERVICAL SPINE FINDINGS  Reversal of the normal cervical lordosis. Vertebral body height and alignment are otherwise maintained. No degenerative changes. Soft tissues are  unremarkable. Lung apices are clear.  IMPRESSION: 1. No acute intracranial abnormality. 2. Reversal of  the normal cervical lordosis. Otherwise, negative cervical spine.   Electronically Signed   By: Leanna Battles M.D.   On: 08/01/2013 17:25   Ct Cervical Spine Wo Contrast  08/01/2013   CLINICAL DATA:  Left arm tingling and headache.  Nausea.  EXAM: CT HEAD WITHOUT CONTRAST  CT CERVICAL SPINE WITHOUT CONTRAST  TECHNIQUE: Multidetector CT imaging of the head and cervical spine was performed following the standard protocol without intravenous contrast. Multiplanar CT image reconstructions of the cervical spine were also generated.  COMPARISON:  None.  FINDINGS: CT HEAD FINDINGS  No evidence of an acute infarct, acute hemorrhage, mass lesion, mass effect or hydrocephalus. No air-fluid levels in the visualized portions of the paranasal sinuses and mastoid air cells.  CT CERVICAL SPINE FINDINGS  Reversal of the normal cervical lordosis. Vertebral body height and alignment are otherwise maintained. No degenerative changes. Soft tissues are unremarkable. Lung apices are clear.  IMPRESSION: 1. No acute intracranial abnormality. 2. Reversal of the normal cervical lordosis. Otherwise, negative cervical spine.   Electronically Signed   By: Leanna Battles M.D.   On: 08/01/2013 17:25      Imaging Review Ct Head Wo Contrast  08/01/2013   CLINICAL DATA:  Left arm tingling and headache.  Nausea.  EXAM: CT HEAD WITHOUT CONTRAST  CT CERVICAL SPINE WITHOUT CONTRAST  TECHNIQUE: Multidetector CT imaging of the head and cervical spine was performed following the standard protocol without intravenous contrast. Multiplanar CT image reconstructions of the cervical spine were also generated.  COMPARISON:  None.  FINDINGS: CT HEAD FINDINGS  No evidence of an acute infarct, acute hemorrhage, mass lesion, mass effect or hydrocephalus. No air-fluid levels in the visualized portions of the paranasal sinuses and mastoid air cells.  CT  CERVICAL SPINE FINDINGS  Reversal of the normal cervical lordosis. Vertebral body height and alignment are otherwise maintained. No degenerative changes. Soft tissues are unremarkable. Lung apices are clear.  IMPRESSION: 1. No acute intracranial abnormality. 2. Reversal of the normal cervical lordosis. Otherwise, negative cervical spine.   Electronically Signed   By: Leanna Battles M.D.   On: 08/01/2013 17:25   Ct Cervical Spine Wo Contrast  08/01/2013   CLINICAL DATA:  Left arm tingling and headache.  Nausea.  EXAM: CT HEAD WITHOUT CONTRAST  CT CERVICAL SPINE WITHOUT CONTRAST  TECHNIQUE: Multidetector CT imaging of the head and cervical spine was performed following the standard protocol without intravenous contrast. Multiplanar CT image reconstructions of the cervical spine were also generated.  COMPARISON:  None.  FINDINGS: CT HEAD FINDINGS  No evidence of an acute infarct, acute hemorrhage, mass lesion, mass effect or hydrocephalus. No air-fluid levels in the visualized portions of the paranasal sinuses and mastoid air cells.  CT CERVICAL SPINE FINDINGS  Reversal of the normal cervical lordosis. Vertebral body height and alignment are otherwise maintained. No degenerative changes. Soft tissues are unremarkable. Lung apices are clear.  IMPRESSION: 1. No acute intracranial abnormality. 2. Reversal of the normal cervical lordosis. Otherwise, negative cervical spine.   Electronically Signed   By: Leanna Battles M.D.   On: 08/01/2013 17:25     EKG Interpretation None      MDM   Final diagnoses:  Headache  Tingling in extremities    Patient had been experiencing headache and left arm tingling and occasionally leg tingling patient workup here today negative. Patient given resource guide. Patient currently not having any symptoms if they recur for any new or worse symptoms patient will followup. No  neuro deficits on exam patient nontoxic no acute distress.    Vanetta Mulders, MD 08/01/13 1807

## 2013-08-01 NOTE — Discharge Instructions (Signed)
Return for any newer worse symptoms. Resource guide provided below to help you find a regular doctor. Today's scan of your head and neck without any significant findings.    Emergency Department Resource Guide 1) Find a Doctor and Pay Out of Pocket Although you won't have to find out who is covered by your insurance plan, it is a good idea to ask around and get recommendations. You will then need to call the office and see if the doctor you have chosen will accept you as a new patient and what types of options they offer for patients who are self-pay. Some doctors offer discounts or will set up payment plans for their patients who do not have insurance, but you will need to ask so you aren't surprised when you get to your appointment.  2) Contact Your Local Health Department Not all health departments have doctors that can see patients for sick visits, but many do, so it is worth a call to see if yours does. If you don't know where your local health department is, you can check in your phone book. The CDC also has a tool to help you locate your state's health department, and many state websites also have listings of all of their local health departments.  3) Find a Walk-in Clinic If your illness is not likely to be very severe or complicated, you may want to try a walk in clinic. These are popping up all over the country in pharmacies, drugstores, and shopping centers. They're usually staffed by nurse practitioners or physician assistants that have been trained to treat common illnesses and complaints. They're usually fairly quick and inexpensive. However, if you have serious medical issues or chronic medical problems, these are probably not your best option.  No Primary Care Doctor: - Call Health Connect at  [phone] - they can help you locate a primary care doctor that  accepts your insurance, provides certain services, etc. - Physician Referral Service- [phone]  Chronic Pain  Problems: Organization         Address  Phone   Notes  [facility]  [phone] Patients need to be referred by their primary care doctor.   Medication Assistance: Organization         Address  Phone   Notes  [facility] [address] [place] [place], [place] [address] [phone] --Must be a resident of [facility] -- Must have NO insurance coverage whatsoever (no Medicaid/ Medicare, etc.) -- The pt. MUST have a primary care doctor that directs their care regularly and follows them in the community   [facility]  [phone]   [employer]  [phone]    Agencies that provide inexpensive medical care: Organization         Address  Phone   Notes  [facility]  [phone]   [facility]    [phone]   [facility] [address] [place], [place] [address] [phone]   [facility] [address] [place] [place] [phone]   Planned Parenthood    [phone]   [facility]    [phone]   [facility]  [address],  Phone:  [phone], Fax:  [phone] Hours of Operation:  9 am - 6 pm, M-F.  Also accepts Medicaid/Medicare and self-pay.  [facility]  [address]  [place], [place], [place] Phone: [phone], Fax: [phone]. Hours of Operation:  8:30 am - 5:30 pm, M-F.  Also accepts Medicaid and self-pay.  [facility] [place] [address], [place] Phone: [phone]   [place], [place], [place] [phone], Ext. [phone] Mondays & Thursdays: 7-9 AM.  First 15 patients are seen on a first come, first serve basis.    [place] Providers:  Organization         Address  Phone   Notes  [facility] [address], [place] [phone] Also  accepts self-pay patients.  [facility] [address], [place]  [phone]   [place], [address], [place] [phone]   [facility] [address] [place] [phone]   Lucianne Lei [address], [place]   [phone] Only accepts [place] Access [place] patients after they have their name applied to their card.   Self-Pay (no insurance) in [facility]:  Organization         Address  Phone   Notes  Sickle Cell Patients, [facility] [place] [phone]   [facility] [place] [phone]   [facility] [place]  [place], [place],  [phone]   [facility]/Dr. Osei-Bonsu  [address] [place] or [address], [place] [phone] Phone number for both [place] and [place] locations is the same.  [facility] and [facility] [address], [place] [phone]   [facility] [address] [place] or [address] [phone]   [facility] [address], [place] [phone], phone; [phone], fax Sees patients 1st and 3rd Saturday of every month.  Must not qualify for public or private insurance (i.e. Medicaid, Medicare, [place] Health Choice, Veterans' Benefits)  Household income should be no more than 200% of the poverty level The clinic cannot treat you if you are pregnant or think you are pregnant  Sexually transmitted diseases are not treated at the clinic.    Dental Care: Organization         Address  Phone  Notes  [facility] [place] [phone] Accepts children up to age 34 who are enrolled in [place] or [place]; pregnant  women with a Medicaid card; and children who have applied for Medicaid or [place] Health Choice, but were declined, whose parents can pay a reduced fee at time of service.  [facility]  [address], [place] [phone] Accepts children up to age 24 who are enrolled in [place] or [place]; pregnant women with a Medicaid card; and children who have applied for Medicaid or [place] Health Choice, but were declined, whose parents can pay a reduced fee at time of service.  [facility]  [place] [phone] Patients are seen by appointment only. Walk-ins are not accepted. [place] will see patients 57 years of age and older. Monday - Tuesday (8am-5pm) Most Wednesdays (8:30-5pm) $30 per visit, cash only  [place]  [address], [place] [phone] Patients are seen by appointment only. Walk-ins are not accepted. [facility] will see patients 18 years of age and older. °One Wednesday Evening (Monthly: Volunteer Based).  $30 per visit, cash only  °[facility]  [phone] for adults; Children under age 4, call Graduate Pediatric Dentistry at [phone]. Children aged 4-14, please call [phone] to request a pediatric application. ° Dental services are provided in all areas of dental care including fillings, crowns and bridges, complete and partial dentures, implants, gum treatment, root canals, and extractions. Preventive care is also provided. Treatment is provided to both adults and children. °Patients are selected via a lottery and there is often a waiting list. °  °[facility] [address], °[place] ° ([phone] [email or web address].[email or web address] °  °[facility] [address], [place], [place] [phone], Ext. [phone] Second and Fourth Thursday of each month, opens at 6:30 AM; Clinic ends at 9 AM.  Patients are  seen on a first-come first-served basis, and a limited number are seen during each clinic.  ° °[facility] ° [address], [place], [place] [phone]   Eligibility Requirements °You must have lived in [place], [place], or [place] counties for at least the last three months. °  You cannot be eligible for state or federal sponsored healthcare insurance, including Veterans Administration, Medicaid, or Medicare. °  You generally cannot be eligible for healthcare insurance through your employer.  °  How to apply: °Eligibility screenings are held every Tuesday and Wednesday afternoon from 1:00 pm until 4:00 pm. You do not need an appointment for the interview!  °[facility] [address], [place], [place] [phone]   °[facility]  [phone]   °[facility]  [phone]   °[facility]  [phone]   ° °Behavioral Health Resources in the Community: °Intensive Outpatient Programs °Organization         Address  Phone  Notes  °[facility] [address], [place], [place] [phone]   °[facility] [address], [place], [place] [phone]   °[facility] [address], [place], [place] ° [phone]   °[facility] [address],  °[place], [place] 1-800-853-5163 or 336-641-4981   °Substance Abuse Resources °Organization         Address  Phone  Notes  °[facility]  336-882-2125   °[facility]  336-784-9470   °[facility]  336-285-9073   °[facility]  336-845-3988   °Residential & Outpatient Substance Abuse Program  1-800-659-3381   °Psychological Services °Organization         Address  Phone  Notes  ° Health  336- [phone]   °[facility]  336- 378-7881   °[facility] [address], [place] 1-800-853-5163 or 336-641-4981   ° °Mobile Crisis  Teams °Organization         Address  Phone  Notes  °[facility], Mobile Crisis Care Unit  1-877-626-1772   °Assertive °Psychotherapeutic Services ° [address] [place], [place] 336-834-9664   °Sharon DeEsch [address] °[place] 336-554-5454   ° °Self-Help/Support Groups °Organization         Address  Phone             Notes  °[facility] [place] -   variety of support groups  336- 336-389-6044 Call for more information  Narcotics Anonymous (NA), [facility] [address], [employer] [place]  2 meetings at this location   Residential Facilities manager         Address  Phone  Notes  [facility] [place],    [place]  [phone]   [facility]  [address] [place] [phone], [place], [place]   [place], [place] [phone] Admissions: 8am-3pm M-F  [facility] [place] [address],    [place], [place] [phone]   [facility] [address] [place], [place], [place]   [facility] [address]  [place], [place]   [facility] - Intensive Outpatient [address], [place], [place]   [facility] ([place] [address]  [place], [place] 1-640-386-3300 or [phone]   [facility] ([facility]) [address] [place], [place] Accepts Medicaid  [facility] [place] [address],  [place] 1-646-193-9807 Substance Abuse/Addiction Treatment   [facility] Organization         Address  Phone  Notes  [facility]  [phone]   Domenic Schwab, PhD [address] [place], [place]   ([phone]) [phone] or [phone]   [facility] [place], [place] [phone]   [facility] [address] [place], [place] [phone]  Insurance/Medicaid/sponsorship through [facility] and Families [address] [place]                                    [place], [place] [phone] [place] [address], [place] [phone]    Dr. Adele Schilder  [phone]   [facility] Dept. 1) [address] [place] 2) [place] 3)  [place] [address], [place] [phone]  [phone]   [place] [phone] or [phone] (After Hours)

## 2013-08-01 NOTE — ED Notes (Signed)
Pt reports tingling in left arm Saturday and never went away. Woke him from sleep. Reported yesterday having a headache. Last night began to feel nauseated. Woke up last night still feeling sick and still having tingling.

## 2013-10-14 ENCOUNTER — Emergency Department (HOSPITAL_COMMUNITY)
Admission: EM | Admit: 2013-10-14 | Discharge: 2013-10-14 | Disposition: A | Discharge status: Home / Self Care | Payer: No Typology Code available for payment source | Attending: Emergency Medicine | Admitting: Emergency Medicine

## 2013-10-14 ENCOUNTER — Encounter (HOSPITAL_COMMUNITY): Payer: Self-pay | Admitting: Emergency Medicine

## 2013-10-14 DIAGNOSIS — R42 Dizziness and giddiness: Secondary | ICD-10-CM | POA: Diagnosis not present

## 2013-10-14 DIAGNOSIS — F17200 Nicotine dependence, unspecified, uncomplicated: Secondary | ICD-10-CM | POA: Insufficient documentation

## 2013-10-14 DIAGNOSIS — Z79899 Other long term (current) drug therapy: Secondary | ICD-10-CM | POA: Diagnosis not present

## 2013-10-14 DIAGNOSIS — L509 Urticaria, unspecified: Secondary | ICD-10-CM | POA: Insufficient documentation

## 2013-10-14 DIAGNOSIS — R0602 Shortness of breath: Secondary | ICD-10-CM | POA: Insufficient documentation

## 2013-10-14 DIAGNOSIS — R21 Rash and other nonspecific skin eruption: Secondary | ICD-10-CM | POA: Insufficient documentation

## 2013-10-14 MED ORDER — FAMOTIDINE 20 MG PO TABS
20.0000 mg | ORAL_TABLET | Freq: Once | ORAL | Status: AC
  Administered 2013-10-14: 20 mg via ORAL
  Filled 2013-10-14: qty 1

## 2013-10-14 MED ORDER — PREDNISONE 10 MG PO TABS: 20.0000 mg | ORAL_TABLET | Freq: Every day | ORAL | Status: DC

## 2013-10-14 MED ORDER — FAMOTIDINE 20 MG PO TABS: 20.0000 mg | ORAL_TABLET | Freq: Two times a day (BID) | ORAL | Status: DC

## 2013-10-14 MED ORDER — HYDROXYZINE HCL 25 MG PO TABS
25.0000 mg | ORAL_TABLET | Freq: Once | ORAL | Status: AC
  Administered 2013-10-14: 25 mg via ORAL
  Filled 2013-10-14: qty 1

## 2013-10-14 MED ORDER — PREDNISONE 20 MG PO TABS
60.0000 mg | ORAL_TABLET | Freq: Once | ORAL | Status: AC
  Administered 2013-10-14: 60 mg via ORAL
  Filled 2013-10-14: qty 3

## 2013-10-14 MED ORDER — HYDROXYZINE HCL 10 MG PO TABS: 10.0000 mg | ORAL_TABLET | Freq: Three times a day (TID) | ORAL | Status: DC | PRN

## 2013-10-14 NOTE — ED Notes (Signed)
Pt presents with "itching and hives" x1 week. Pt reports eating mushrooms recently, states he has eaten them in the past but not that many. Pt also reports getting a new couch from rent a center last Friday, pt states the couch was a used couch. Pt reports he has been using benadryl and hydrocortisone with no relief. Pt denies anyone else in the house having the same symptoms.

## 2013-10-14 NOTE — ED Notes (Addendum)
C/o "hives & itching". Insect bites noted to back, buttocks, legs and arms. No hives noted. Onset 1 week ago. No lasting relief with hydrocortisone or benadryl. Also mentions some abd cramps, come and go, none now. Mentions eating mushrooms and getting a "new" used couch. (denies: nvd or fever).

## 2013-10-14 NOTE — Discharge Instructions (Signed)
Hives Hives are itchy, red, swollen areas of the skin. They can vary in size and location on your body. Hives can come and go for hours or several days (acute hives) or for several weeks (chronic hives). Hives do not spread from person to person (noncontagious). They may get worse with scratching, exercise, and emotional stress. CAUSES   Allergic reaction to food, additives, or drugs.  Infections, including the common cold.  Illness, such as vasculitis, lupus, or thyroid disease.  Exposure to sunlight, heat, or cold.  Exercise.  Stress.  Contact with chemicals. SYMPTOMS   Red or white swollen patches on the skin. The patches may change size, shape, and location quickly and repeatedly.  Itching.  Swelling of the hands, feet, and face. This may occur if hives develop deeper in the skin. DIAGNOSIS  Your caregiver can usually tell what is wrong by performing a physical exam. Skin or blood tests may also be done to determine the cause of your hives. In some cases, the cause cannot be determined. TREATMENT  Mild cases usually get better with medicines such as antihistamines. Severe cases may require an emergency epinephrine injection. If the cause of your hives is known, treatment includes avoiding that trigger.  HOME CARE INSTRUCTIONS   Avoid causes that trigger your hives.  Take antihistamines as directed by your caregiver to reduce the severity of your hives. Non-sedating or low-sedating antihistamines are usually recommended. Do not drive while taking an antihistamine.  Take any other medicines prescribed for itching as directed by your caregiver.  Wear loose-fitting clothing.  Keep all follow-up appointments as directed by your caregiver. SEEK MEDICAL CARE IF:   You have persistent or severe itching that is not relieved with medicine.  You have painful or swollen joints. SEEK IMMEDIATE MEDICAL CARE IF:   You have a fever.  Your tongue or lips are swollen.  You have  trouble breathing or swallowing.  You feel tightness in the throat or chest.  You have abdominal pain. These problems may be the first sign of a life-threatening allergic reaction. Call your local emergency services (911 in U.S.). MAKE SURE YOU:   Understand these instructions.  Will watch your condition.  Will get help right away if you are not doing well or get worse. Document Released: 02/10/2005 Document Revised: 02/15/2013 Document Reviewed: 05/06/2011 ExitCare Patient Information 2015 [employer]. This information is not intended to replace advice given to you by your health care provider. Make sure you discuss any questions you have with your health care provider.  

## 2013-10-14 NOTE — ED Provider Notes (Signed)
CSN: [id number]     Arrival date & time 10/14/13  1945 History  This chart was scribed for a non-physician practitioner, Conard Novakavid J. Bev Drennen, NP-C, working with Hurman HornJohn M Bednar, MD by [place] Peace, ED Scribe. [facility] The patient's care was started at 10:05 PM.    Chief Complaint  Patient presents with  . Insect Bite      Patient is a 23 y.o. male presenting with rash. The history is provided by the patient. No language interpreter was used.  Rash Location:  Leg, shoulder/arm and torso Shoulder/arm rash location:  R arm and L arm Torso rash location:  Upper back and lower back Leg rash location:  L leg and R leg Duration:  1 week Context: food   Associated symptoms: shortness of breath   Associated symptoms: no diarrhea, no fever, no nausea and not vomiting   Shortness of breath:    Severity:  Moderate   Progression:  Partially resolved HPI Comments: Mark StanfordRichard Johnston is a 23 y.o. male who presents to the Emergency Department complaining of hives onset 1 week ago to his back, buttocks, legs, and arms. Pt reports that he was experiencing SOB and dizziness initially but that has partially subsided. He states that he is not sure what could be responsible for his current problems. He reports that he ate some mushrooms the day that he started breaking out which he is not sure whether or not he is allergic to and also got a new "used" couch that same day as well. He denies nausea, vomiting, diarrhea, or fever. He has tried using hydrocortisone and benadryl without much relief.   History reviewed. No pertinent past medical history. History reviewed. No pertinent past surgical history. No family history on file. History  Substance Use Topics  . Smoking status: Current Some Day Smoker -- 0.50 packs/day  . Smokeless tobacco: Not on file  . Alcohol Use: Yes    Review of Systems  Constitutional: Negative for fever.  Respiratory: Positive for shortness of breath.    Gastrointestinal: Negative for nausea, vomiting and diarrhea.  Skin: Positive for rash.  Neurological: Positive for dizziness.  All other systems reviewed and are negative.     Allergies  Review of patient's allergies indicates no known allergies.  Home Medications   Prior to Admission medications   Medication Sig Start Date End Date Taking? Authorizing Provider  diphenhydrAMINE (SOMINEX) 25 MG tablet Take 50 mg by mouth 2 (two) times daily.   Yes Historical Provider, MD  hydrocortisone cream 1 % Apply 1 application topically daily as needed for itching.   Yes Historical Provider, MD  Multiple Vitamin (MULTIVITAMIN) tablet Take 2 tablets by mouth daily.   Yes Historical Provider, MD   BP 137/73  Pulse 80  Temp(Src) 98.4 F (36.9 C) (Oral)  Resp 22  SpO2 99% Physical Exam  Nursing note and vitals reviewed. Constitutional: He is oriented to person, place, and time. He appears well-developed and well-nourished. No distress.  HENT:  Head: Normocephalic and atraumatic.  Eyes: Conjunctivae and EOM are normal.  Neck: Neck supple. No tracheal deviation present.  Cardiovascular: Normal rate.   Pulmonary/Chest: Effort normal. No respiratory distress.  Musculoskeletal: Normal range of motion.  Neurological: He is alert and oriented to person, place, and time.  Skin: Skin is warm and dry. Rash noted.  Rash to back, legs, arms, and buttocks.   Psychiatric: He has a normal mood and affect. His behavior is normal.  ED Course  Procedures (including critical care time) Labs Review Labs Reviewed - No data to display  Results for orders placed during the hospital encounter of 08/01/13  COMPREHENSIVE METABOLIC PANEL      Result Value Ref Range   Sodium 140  137 - 147 mEq/L   Potassium 4.4  3.7 - 5.3 mEq/L   Chloride 100  96 - 112 mEq/L   CO2 27  19 - 32 mEq/L   Glucose, Bld 93  70 - 99 mg/dL   BUN 10  6 - 23 mg/dL   Creatinine, Ser 1.61  0.50 - 1.35 mg/dL   Calcium 09.6  8.4 -  10.5 mg/dL   Total Protein 8.0  6.0 - 8.3 g/dL   Albumin 4.3  3.5 - 5.2 g/dL   AST 22  0 - 37 U/L   ALT 33  0 - 53 U/L   Alkaline Phosphatase 57  39 - 117 U/L   Total Bilirubin 0.2 (*) 0.3 - 1.2 mg/dL   GFR calc non Af Amer >90  >90 mL/min   GFR calc Af Amer >90  >90 mL/min  CBC      Result Value Ref Range   WBC 6.1  4.0 - 10.5 K/uL   RBC 5.11  4.22 - 5.81 MIL/uL   Hemoglobin 15.1  13.0 - 17.0 g/dL   HCT 04.5  40.9 - 81.1 %   MCV 84.9  78.0 - 100.0 fL   MCH 29.5  26.0 - 34.0 pg   MCHC 34.8  30.0 - 36.0 g/dL   RDW 91.4  78.2 - 95.6 %   Platelets 298  150 - 400 K/uL   No results found.    Imaging Review No results found.   EKG Interpretation None     Medications - No data to display  10:10 PM- Treatment plan was discussed with patient who verbalizes understanding and agrees.   MDM  Patient with several day history of mild hives with urticaria.  Reports mild shortness of breath with initial onset, but quickly resolved.  No dyspnea at present.  Lungs CTA b/l.  No angioedema.  Will give pepcid, atarax, prednisone in ED.  Home with prescriptions to continue at home.  Return precautions discussed. Final diagnoses:  None    Urticaria of unknown origin  I personally performed the services described in this documentation, which was scribed in my presence. The recorded information has been reviewed and is accurate.    Jimmye Norman, NP 10/14/13 [id number]

## 2013-10-25 NOTE — ED Provider Notes (Signed)
Medical screening examination/treatment/procedure(s) were performed by non-physician practitioner and as supervising physician I was immediately available for consultation/collaboration.   EKG Interpretation None       Hurman Horn, MD 10/25/13 2027

## 2014-06-13 ENCOUNTER — Encounter (HOSPITAL_COMMUNITY): Payer: Self-pay | Admitting: *Deleted

## 2014-06-13 ENCOUNTER — Emergency Department (HOSPITAL_COMMUNITY)
Admission: EM | Admit: 2014-06-13 | Discharge: 2014-06-13 | Disposition: A | Discharge status: Home / Self Care | Payer: No Typology Code available for payment source | Attending: Emergency Medicine | Admitting: Emergency Medicine

## 2014-06-13 DIAGNOSIS — Z79899 Other long term (current) drug therapy: Secondary | ICD-10-CM | POA: Insufficient documentation

## 2014-06-13 DIAGNOSIS — B349 Viral infection, unspecified: Secondary | ICD-10-CM | POA: Diagnosis not present

## 2014-06-13 DIAGNOSIS — M545 Low back pain: Secondary | ICD-10-CM | POA: Diagnosis present

## 2014-06-13 DIAGNOSIS — Z720 Tobacco use: Secondary | ICD-10-CM | POA: Diagnosis not present

## 2014-06-13 LAB — COMPREHENSIVE METABOLIC PANEL
ALT: 20 U/L (ref 0–53)
ANION GAP: 8 (ref 5–15)
AST: 18 U/L (ref 0–37)
Albumin: 3.8 g/dL (ref 3.5–5.2)
Alkaline Phosphatase: 54 U/L (ref 39–117)
BILIRUBIN TOTAL: 0.3 mg/dL (ref 0.3–1.2)
BUN: 12 mg/dL (ref 6–23)
CO2: 28 mmol/L (ref 19–32)
Calcium: 9.2 mg/dL (ref 8.4–10.5)
Chloride: 101 mmol/L (ref 96–112)
Creatinine, Ser: 1.08 mg/dL (ref 0.50–1.35)
Glucose, Bld: 101 mg/dL — ABNORMAL HIGH (ref 70–99)
POTASSIUM: 3.8 mmol/L (ref 3.5–5.1)
Sodium: 137 mmol/L (ref 135–145)
TOTAL PROTEIN: 6.4 g/dL (ref 6.0–8.3)

## 2014-06-13 LAB — CBC WITH DIFFERENTIAL/PLATELET
BASOS ABS: 0 10*3/uL (ref 0.0–0.1)
BASOS PCT: 0 % (ref 0–1)
EOS PCT: 1 % (ref 0–5)
Eosinophils Absolute: 0 10*3/uL (ref 0.0–0.7)
HEMATOCRIT: 39.3 % (ref 39.0–52.0)
Hemoglobin: 13.4 g/dL (ref 13.0–17.0)
LYMPHS PCT: 29 % (ref 12–46)
Lymphs Abs: 1.2 10*3/uL (ref 0.7–4.0)
MCH: 28.8 pg (ref 26.0–34.0)
MCHC: 34.1 g/dL (ref 30.0–36.0)
MCV: 84.5 fL (ref 78.0–100.0)
MONO ABS: 0.5 10*3/uL (ref 0.1–1.0)
Monocytes Relative: 13 % — ABNORMAL HIGH (ref 3–12)
Neutro Abs: 2.4 10*3/uL (ref 1.7–7.7)
Neutrophils Relative %: 57 % (ref 43–77)
PLATELETS: 255 10*3/uL (ref 150–400)
RBC: 4.65 MIL/uL (ref 4.22–5.81)
RDW: 12.3 % (ref 11.5–15.5)
WBC: 4.1 10*3/uL (ref 4.0–10.5)

## 2014-06-13 LAB — URINALYSIS, ROUTINE W REFLEX MICROSCOPIC
BILIRUBIN URINE: NEGATIVE
Glucose, UA: NEGATIVE mg/dL
HGB URINE DIPSTICK: NEGATIVE
Ketones, ur: NEGATIVE mg/dL
Leukocytes, UA: NEGATIVE
NITRITE: NEGATIVE
PH: 5.5 (ref 5.0–8.0)
Protein, ur: NEGATIVE mg/dL
Specific Gravity, Urine: 1.036 — ABNORMAL HIGH (ref 1.005–1.030)
Urobilinogen, UA: 1 mg/dL (ref 0.0–1.0)

## 2014-06-13 LAB — LIPASE, BLOOD: Lipase: 18 U/L (ref 11–59)

## 2014-06-13 MED ORDER — SODIUM CHLORIDE 0.9 % IV BOLUS (SEPSIS)
1000.0000 mL | INTRAVENOUS | Status: AC
  Administered 2014-06-13: 1000 mL via INTRAVENOUS

## 2014-06-13 MED ORDER — ONDANSETRON HCL 4 MG PO TABS: 4.0000 mg | ORAL_TABLET | Freq: Four times a day (QID) | ORAL | Status: DC

## 2014-06-13 MED ORDER — ONDANSETRON HCL 4 MG/2ML IJ SOLN
4.0000 mg | INTRAMUSCULAR | Status: AC
  Administered 2014-06-13: 4 mg via INTRAVENOUS
  Filled 2014-06-13: qty 2

## 2014-06-13 MED ORDER — NAPROXEN 500 MG PO TABS: 500.0000 mg | ORAL_TABLET | Freq: Two times a day (BID) | ORAL | Status: DC

## 2014-06-13 MED ORDER — KETOROLAC TROMETHAMINE 30 MG/ML IJ SOLN
30.0000 mg | Freq: Once | INTRAMUSCULAR | Status: AC
  Administered 2014-06-13: 30 mg via INTRAVENOUS
  Filled 2014-06-13: qty 1

## 2014-06-13 NOTE — ED Notes (Signed)
Pt arrives from home via POV. Pt states he has been febrile with nvd since Monday and last night he began having back pain in his lower back. Pt states he is urinating infrequently, but denies burning with urination or pain and no d/c from penis.

## 2014-06-13 NOTE — Discharge Instructions (Signed)
Please follow the directions provided. Use the resource guide or the referral given to establish care with a primary care doctor to ensure you're getting better. Please use the Zofran to help with nausea and began to slowly resume normal fluid intake by mouth. He may take the naproxen twice a day to help with pain and inflammation. Don't hesitate to return for any new, worsening, or concerning symptoms.   SEEK IMMEDIATE MEDICAL CARE IF:  You have severe headaches, shortness of breath, chest pain, neck pain, or an unusual rash.  You have uncontrolled vomiting, diarrhea, or you are unable to keep down fluids.  You or your child has an oral temperature above 102 F (38.9 C), not controlled by medicine.

## 2014-06-13 NOTE — ED Provider Notes (Signed)
CSN: [id number]     Arrival date & time 06/13/14  [id number] History   First MD Initiated Contact with Patient 06/13/14 0913     Chief Complaint  Patient presents with  . Back Pain   (Consider location/radiation/quality/duration/timing/severity/associated sxs/prior Treatment) HPI Mark Johnston is a 24 year old male presenting with back pain and nausea vomiting diarrhea. He states the vomiting and diarrhea began around 4 AM one day ago. He states he's had  6 episodes of emesis and 5 or 6 episodes of diarrhea. He also reports subjective fever and chills. After the vomiting and diarrhea subsided he noticed pain in his lower back. He rates the pain at an 8 out of 10 described as aching pain across both sides of his lower back area. He denies any radiation of the pain down his leg, he also denies any urinary symptoms or abdominal pain currently.   History reviewed. No pertinent past medical history. History reviewed. No pertinent past surgical history. History reviewed. No pertinent family history. History  Substance Use Topics  . Smoking status: Current Some Day Smoker -- 0.50 packs/day  . Smokeless tobacco: Not on file  . Alcohol Use: Yes    Review of Systems  Constitutional: Negative for fever and chills.  HENT: Negative for sore throat.   Eyes: Negative for visual disturbance.  Respiratory: Negative for cough and shortness of breath.   Cardiovascular: Negative for chest pain and leg swelling.  Gastrointestinal: Positive for nausea, vomiting, abdominal pain and diarrhea.  Genitourinary: Negative for dysuria.  Musculoskeletal: Positive for myalgias and back pain.  Skin: Negative for rash.  Neurological: Negative for weakness, numbness and headaches.      Allergies  Review of patient's allergies indicates no known allergies.  Home Medications   Prior to Admission medications   Medication Sig Start Date End Date Taking? Authorizing Provider  diphenhydrAMINE (SOMINEX) 25 MG tablet Take  50 mg by mouth 2 (two) times daily.    Historical Provider, MD  famotidine (PEPCID) 20 MG tablet Take 1 tablet (20 mg total) by mouth 2 (two) times daily. 10/14/13   Felicie Mornavid Smith, NP  hydrocortisone cream 1 % Apply 1 application topically daily as needed for itching.    Historical Provider, MD  hydrOXYzine (ATARAX/VISTARIL) 10 MG tablet Take 1 tablet (10 mg total) by mouth 3 (three) times daily as needed for itching. 10/14/13   Felicie Mornavid Smith, NP  Multiple Vitamin (MULTIVITAMIN) tablet Take 2 tablets by mouth daily.    Historical Provider, MD  predniSONE (DELTASONE) 10 MG tablet Take 2 tablets (20 mg total) by mouth daily. 10/14/13   Felicie Mornavid Smith, NP   Temp(Src) 98.6 F (37 C) (Oral)  Ht 5\' 7"  (1.702 m)  Wt 220 lb (99.791 kg)  BMI 34.45 kg/m2 Physical Exam  Constitutional: He appears well-developed and well-nourished. No distress.  HENT:  Head: Normocephalic and atraumatic.  Mouth/Throat: Mucous membranes are dry.  Eyes: Conjunctivae are normal.  Neck: Neck supple. No thyromegaly present.  Cardiovascular: Normal rate, regular rhythm and intact distal pulses.   Pulmonary/Chest: Effort normal and breath sounds normal. No respiratory distress. He has no wheezes. He has no rales. He exhibits no tenderness.  Abdominal: Soft. Normal appearance. He exhibits no distension and no mass. There is no hepatosplenomegaly. There is no tenderness. There is no rigidity, no rebound, no guarding, no CVA tenderness, no tenderness at McBurney's point and negative Murphy's sign.  Musculoskeletal: He exhibits tenderness.  Reproducible bilat lumbar TTP, no bony tenderness noted.    Lymphadenopathy:  He has no cervical adenopathy.  Neurological: He is alert.  Skin: Skin is warm and dry. No rash noted. He is not diaphoretic.  Psychiatric: He has a normal mood and affect.  Nursing note and vitals reviewed.   ED Course  Procedures (including critical care time) Labs Review Labs Reviewed  CBC WITH  DIFFERENTIAL/PLATELET - Abnormal; Notable for the following:    Monocytes Relative 13 (*)    All other components within normal limits  COMPREHENSIVE METABOLIC PANEL - Abnormal; Notable for the following:    Glucose, Bld 101 (*)    All other components within normal limits  URINALYSIS, ROUTINE W REFLEX MICROSCOPIC - Abnormal; Notable for the following:    Specific Gravity, Urine 1.036 (*)    All other components within normal limits  LIPASE, BLOOD    Imaging Review No results found.   EKG Interpretation None      MDM   Final diagnoses:  Viral illness   24 yo with symptoms consistent with a viral illness causing nausea, vomiting and diarrhea and subsequent muscle strain on his lower back.  He was treated with NS bolus and toradol and zofran and symptoms resolved. He is tolerating PO fluids in the ED.  No focal abdominal pain, no concern for appendicitis, cholecystitis, pancreatitis, ruptured viscus, UTI, kidney stone, or any other abdominal etiology.  Pt is well-appearing, in no acute distress and vital signs reviewed and not concerning. He appears safe to be discharged.  Discharge include resources to establish care with a PCP.  Return precautions provided.  Pt aware of plan and in agreement.    Filed Vitals:   06/13/14 1015 06/13/14 1030 06/13/14 1045 06/13/14 1100  BP: 113/93 121/79 108/73 109/61  Pulse: 56 65 55 56  Temp:      TempSrc:      Resp:    19  Height:      Weight:      SpO2: 96% 96% 99% 100%   Meds given in ED:  Medications  sodium chloride 0.9 % bolus 1,000 mL (not administered)  ketorolac (TORADOL) 30 MG/ML injection 30 mg (not administered)  ondansetron (ZOFRAN) injection 4 mg (not administered)    Discharge Medication List as of 06/13/2014 10:49 AM    START taking these medications   Details  naproxen (NAPROSYN) 500 MG tablet Take 1 tablet (500 mg total) by mouth 2 (two) times daily., Starting 06/13/2014, Until Discontinued, Print    ondansetron  (ZOFRAN) 4 MG tablet Take 1 tablet (4 mg total) by mouth every 6 (six) hours., Starting 06/13/2014, Until Discontinued, Print           Harle Battiest, NP 06/14/14 1610  Richardean Canal, MD 06/16/14 1037

## 2014-06-13 NOTE — ED Notes (Signed)
Pt is in stable condition upon d/c and ambulates from ED escorted by this RN. 

## 2014-09-11 ENCOUNTER — Emergency Department (HOSPITAL_COMMUNITY)
Admission: EM | Admit: 2014-09-11 | Discharge: 2014-09-11 | Disposition: A | Discharge status: Home / Self Care | Payer: No Typology Code available for payment source | Attending: Emergency Medicine | Admitting: Emergency Medicine

## 2014-09-11 ENCOUNTER — Encounter (HOSPITAL_COMMUNITY): Payer: Self-pay | Admitting: Emergency Medicine

## 2014-09-11 DIAGNOSIS — T50905A Adverse effect of unspecified drugs, medicaments and biological substances, initial encounter: Secondary | ICD-10-CM

## 2014-09-11 DIAGNOSIS — F1210 Cannabis abuse, uncomplicated: Secondary | ICD-10-CM | POA: Insufficient documentation

## 2014-09-11 DIAGNOSIS — J069 Acute upper respiratory infection, unspecified: Secondary | ICD-10-CM | POA: Diagnosis not present

## 2014-09-11 DIAGNOSIS — T391X5A Adverse effect of 4-Aminophenol derivatives, initial encounter: Secondary | ICD-10-CM | POA: Insufficient documentation

## 2014-09-11 DIAGNOSIS — F419 Anxiety disorder, unspecified: Secondary | ICD-10-CM | POA: Insufficient documentation

## 2014-09-11 DIAGNOSIS — Z720 Tobacco use: Secondary | ICD-10-CM | POA: Insufficient documentation

## 2014-09-11 DIAGNOSIS — R509 Fever, unspecified: Secondary | ICD-10-CM | POA: Diagnosis present

## 2014-09-11 LAB — CBC
HCT: 41.3 % (ref 39.0–52.0)
HEMOGLOBIN: 14.5 g/dL (ref 13.0–17.0)
MCH: 29.6 pg (ref 26.0–34.0)
MCHC: 35.1 g/dL (ref 30.0–36.0)
MCV: 84.3 fL (ref 78.0–100.0)
Platelets: 334 10*3/uL (ref 150–400)
RBC: 4.9 MIL/uL (ref 4.22–5.81)
RDW: 12.1 % (ref 11.5–15.5)
WBC: 8.9 10*3/uL (ref 4.0–10.5)

## 2014-09-11 LAB — COMPREHENSIVE METABOLIC PANEL
ALK PHOS: 74 U/L (ref 38–126)
ALT: 15 U/L — ABNORMAL LOW (ref 17–63)
AST: 20 U/L (ref 15–41)
Albumin: 4.3 g/dL (ref 3.5–5.0)
Anion gap: 12 (ref 5–15)
BILIRUBIN TOTAL: 0.3 mg/dL (ref 0.3–1.2)
BUN: 9 mg/dL (ref 6–20)
CALCIUM: 9.4 mg/dL (ref 8.9–10.3)
CO2: 26 mmol/L (ref 22–32)
CREATININE: 1.21 mg/dL (ref 0.61–1.24)
Chloride: 101 mmol/L (ref 101–111)
Glucose, Bld: 138 mg/dL — ABNORMAL HIGH (ref 65–99)
Potassium: 3.1 mmol/L — ABNORMAL LOW (ref 3.5–5.1)
Sodium: 139 mmol/L (ref 135–145)
TOTAL PROTEIN: 7.1 g/dL (ref 6.5–8.1)

## 2014-09-11 LAB — RAPID URINE DRUG SCREEN, HOSP PERFORMED
Amphetamines: NOT DETECTED
Barbiturates: NOT DETECTED
Benzodiazepines: NOT DETECTED
COCAINE: NOT DETECTED
Opiates: NOT DETECTED
TETRAHYDROCANNABINOL: POSITIVE — AB

## 2014-09-11 LAB — SALICYLATE LEVEL: Salicylate Lvl: <4 mg/dL (ref 2.8–30.0)

## 2014-09-11 LAB — ETHANOL: Alcohol, Ethyl (B): <5 mg/dL (ref <5)

## 2014-09-11 LAB — ACETAMINOPHEN LEVEL

## 2014-09-11 MED ORDER — LORAZEPAM 1 MG PO TABS
0.5000 mg | ORAL_TABLET | Freq: Once | ORAL | Status: AC
  Administered 2014-09-11: 0.5 mg via ORAL
  Filled 2014-09-11: qty 1

## 2014-09-11 MED ORDER — POTASSIUM CHLORIDE CRYS ER 20 MEQ PO TBCR
40.0000 meq | EXTENDED_RELEASE_TABLET | Freq: Once | ORAL | Status: AC
  Administered 2014-09-11: 40 meq via ORAL
  Filled 2014-09-11: qty 2

## 2014-09-11 MED ORDER — SALINE SPRAY 0.65 % NA SOLN: 1.0000 | NASAL | Status: DC | PRN

## 2014-09-11 NOTE — ED Provider Notes (Signed)
CSN: [id number]     Arrival date & time 09/11/14  0113 History   This chart was scribed for Marisa Severinlga Jocelynn Gioffre, MD by Evon Slackerrance Branch, ED Scribe. This patient was seen in room A06C/A06C and the patient's care was started at 1:28 AM.      Chief Complaint  Patient presents with  . Drug Overdose  . Anxiety   The history is provided by the patient. No language interpreter was used.   HPI Comments: Mark Johnston is a 24 y.o. male brought in by ambulance, who presents to the Emergency Department complaining of possible drug overdose from taking too much generic brand Nyquil or Dayquil 3 hours PTA. Pt states that he has had cold symptoms recently congestion, rhinorrhea and  subjective fever. Pt states that he did not measure the amount took, he states he drank a big swallow straight from the bottle. He states that he may have drank 1/4 of the bottle. He states that afterwards he began to feel confused, dizzy and anxious. Pt states that the more he talks he feels himself calming down and starting to feel better.  Pt does report tobacco use 1x per day. Pt denies ETOH use today. Pt denies any other drug use.  History reviewed. No pertinent past medical history. History reviewed. No pertinent past surgical history. History reviewed. No pertinent family history. History  Substance Use Topics  . Smoking status: Current Some Day Smoker -- 0.10 packs/day  . Smokeless tobacco: Not on file  . Alcohol Use: No    Review of Systems  Constitutional: Positive for fever.  HENT: Positive for congestion and rhinorrhea.   Neurological: Positive for dizziness.  Psychiatric/Behavioral: Positive for confusion. The patient is nervous/anxious.   All other systems reviewed and are negative.     Allergies  Review of patient's allergies indicates no known allergies.  Home Medications   Prior to Admission medications   Medication Sig Start Date End Date Taking? Authorizing Provider  famotidine (PEPCID) 20 MG tablet  Take 1 tablet (20 mg total) by mouth 2 (two) times daily. Patient not taking: Reported on 06/13/2014 10/14/13   Felicie Mornavid Smith, NP  hydrOXYzine (ATARAX/VISTARIL) 10 MG tablet Take 1 tablet (10 mg total) by mouth 3 (three) times daily as needed for itching. Patient not taking: Reported on 06/13/2014 10/14/13   Felicie Mornavid Smith, NP  naproxen (NAPROSYN) 500 MG tablet Take 1 tablet (500 mg total) by mouth 2 (two) times daily. Patient not taking: Reported on 09/11/2014 06/13/14   Harle BattiestElizabeth Tysinger, NP  ondansetron (ZOFRAN) 4 MG tablet Take 1 tablet (4 mg total) by mouth every 6 (six) hours. Patient not taking: Reported on 09/11/2014 06/13/14   Harle BattiestElizabeth Tysinger, NP  predniSONE (DELTASONE) 10 MG tablet Take 2 tablets (20 mg total) by mouth daily. Patient not taking: Reported on 06/13/2014 10/14/13   Felicie Mornavid Smith, NP   BP 126/68 mmHg  Pulse 87  Temp(Src) 98.5 F (36.9 C)  Resp 25  Wt 224 lb (101.606 kg)  SpO2 97%   Physical Exam  Constitutional: He is oriented to person, place, and time. He appears well-developed and well-nourished.  HENT:  Head: Normocephalic and atraumatic.  Right Ear: External ear normal.  Left Ear: External ear normal.  Mouth/Throat: Oropharynx is clear and moist.  Nasal congestion  Eyes: Conjunctivae and EOM are normal. Pupils are equal, round, and reactive to light.  Neck: Normal range of motion. Neck supple. No JVD present. No tracheal deviation present. No thyromegaly present.  Cardiovascular: Normal rate, regular  rhythm, normal heart sounds and intact distal pulses.  Exam reveals no gallop and no friction rub.   No murmur heard. Pulmonary/Chest: Effort normal and breath sounds normal. No stridor. No respiratory distress. He has no wheezes. He has no rales. He exhibits no tenderness.  Abdominal: Soft. Bowel sounds are normal. He exhibits no distension and no mass. There is no tenderness. There is no rebound and no guarding.  Musculoskeletal: Normal range of motion. He exhibits no  edema or tenderness.  Lymphadenopathy:    He has no cervical adenopathy.  Neurological: He is alert and oriented to person, place, and time. He displays normal reflexes. No cranial nerve deficit. He exhibits normal muscle tone. Coordination normal.  Skin: Skin is warm and dry. No rash noted. No erythema. No pallor.  Psychiatric: He has a normal mood and affect. His behavior is normal. Judgment and thought content normal.  Nursing note and vitals reviewed.   ED Course  Procedures (including critical care time) DIAGNOSTIC STUDIES: Oxygen Saturation is 99% on RA, normal by my interpretation.    COORDINATION OF CARE: 1:45 AM-Discussed treatment plan with pt at bedside and pt agreed to plan.     Labs Review Labs Reviewed  COMPREHENSIVE METABOLIC PANEL - Abnormal; Notable for the following:    Potassium 3.1 (*)    Glucose, Bld 138 (*)    ALT 15 (*)    All other components within normal limits  ACETAMINOPHEN LEVEL - Abnormal; Notable for the following:    Acetaminophen (Tylenol), Serum <10 (*)    All other components within normal limits  URINE RAPID DRUG SCREEN, HOSP PERFORMED - Abnormal; Notable for the following:    Tetrahydrocannabinol POSITIVE (*)    All other components within normal limits  ETHANOL  SALICYLATE LEVEL  CBC    Imaging Review No results found.   EKG Interpretation   Date/Time:  Monday September 11 2014 01:36:44 EDT Ventricular Rate:  82 PR Interval:  196 QRS Duration: 101 QT Interval:  346 QTC Calculation: 404 R Axis:   43 Text Interpretation:  Sinus rhythm Probable left atrial enlargement  Borderline T wave abnormalities Confirmed by Arnol Mcgibbon  MD, Derreck Wiltsey ([id number]) on  09/11/2014 2:40:49 AM      MDM   Final diagnoses:  Medication side effect, initial encounter  URI (upper respiratory infection)      24 year old male who reports feeling anxious and confusion after drinking cough syrup tonight.  He estimates one fourth bottle.  Patient smells strongly of  marijuana but denies this.  Plan for Tylenol level, electrolytes.  Patient received Ativan to see if this helps his symptoms.  Patient with URI symptoms, does not appear to have significant infection   Workup shows negative Tylenol level, patient took Tylenol containing cough syrup at 9 PM.  With negative level.  Do not feel he needs further testing.  His urine is positive for marijuana, although he did deny this.  Patient is stable for discharge home.  He has been advised to not use similar cough syrup as he has had an adverse reaction to it.     Marisa Severin, MD 09/11/14 [phone]

## 2014-09-11 NOTE — ED Notes (Signed)
Patient here with complaint of "feeling crazy"; reports anxiety, confusion, jitters, and pacing. States that he may have taken to much cough syrup. Explains that he has had a cold recently and needed something for it; took a big swallow from a bottle Nyquil type OTC medication. That was around 2200 tonight.

## 2014-09-11 NOTE — Discharge Instructions (Signed)
Cool Mist Vaporizers °Vaporizers may help relieve the symptoms of a cough and cold. They add moisture to the air, which helps mucus to become thinner and less sticky. This makes it easier to breathe and cough up secretions. Cool mist vaporizers do not cause serious burns like hot mist vaporizers, which may also be called steamers or humidifiers. Vaporizers have not been proven to help with colds. You should not use a vaporizer if you are allergic to mold. °HOME CARE INSTRUCTIONS °· Follow the package instructions for the vaporizer. °· Do not use anything other than distilled water in the vaporizer. °· Do not run the vaporizer all of the time. This can cause mold or bacteria to grow in the vaporizer. °· Clean the vaporizer after each time it is used. °· Clean and dry the vaporizer well before storing it. °· Stop using the vaporizer if worsening respiratory symptoms develop. °Document Released: 11/08/2003 Document Revised: 02/15/2013 Document Reviewed: 06/30/2012 °ExitCare® Patient Information ©2015 [employer]. This information is not intended to replace advice given to you by your health care provider. Make sure you discuss any questions you have with your health care provider. ° °Upper Respiratory Infection, Adult °An upper respiratory infection (URI) is also sometimes known as the common cold. The upper respiratory tract includes the nose, sinuses, throat, trachea, and bronchi. Bronchi are the airways leading to the lungs. Most people improve within 1 week, but symptoms can last up to 2 weeks. A residual cough may last even longer.  °CAUSES °Many different viruses can infect the tissues lining the upper respiratory tract. The tissues become irritated and inflamed and often become very moist. Mucus production is also common. A cold is contagious. You can easily spread the virus to others by oral contact. This includes kissing, sharing a glass, coughing, or sneezing. Touching your mouth or nose and then touching a  surface, which is then touched by another person, can also spread the virus. °SYMPTOMS  °Symptoms typically develop 1 to 3 days after you come in contact with a cold virus. Symptoms vary from person to person. They may include: °· Runny nose. °· Sneezing. °· Nasal congestion. °· Sinus irritation. °· Sore throat. °· Loss of voice (laryngitis). °· Cough. °· Fatigue. °· Muscle aches. °· Loss of appetite. °· Headache. °· Low-grade fever. °DIAGNOSIS  °You might diagnose your own cold based on familiar symptoms, since most people get a cold 2 to 3 times a year. Your caregiver can confirm this based on your exam. Most importantly, your caregiver can check that your symptoms are not due to another disease such as strep throat, sinusitis, pneumonia, asthma, or epiglottitis. Blood tests, throat tests, and X-rays are not necessary to diagnose a common cold, but they may sometimes be helpful in excluding other more serious diseases. Your caregiver will decide if any further tests are required. °RISKS AND COMPLICATIONS  °You may be at risk for a more severe case of the common cold if you smoke cigarettes, have chronic heart disease (such as heart failure) or lung disease (such as asthma), or if you have a weakened immune system. The very young and very old are also at risk for more serious infections. Bacterial sinusitis, middle ear infections, and bacterial pneumonia can complicate the common cold. The common cold can worsen asthma and chronic obstructive pulmonary disease (COPD). Sometimes, these complications can require emergency medical care and may be life-threatening. °PREVENTION  °The best way to protect against getting a cold is to practice good hygiene.   Avoid oral or hand contact with people with cold symptoms. Wash your hands often if contact occurs. There is no clear evidence that vitamin C, vitamin E, echinacea, or exercise reduces the chance of developing a cold. However, it is always recommended to get plenty of  rest and practice good nutrition. °TREATMENT  °Treatment is directed at relieving symptoms. There is no cure. Antibiotics are not effective, because the infection is caused by a virus, not by bacteria. Treatment may include: °· Increased fluid intake. Sports drinks offer valuable electrolytes, sugars, and fluids. °· Breathing heated mist or steam (vaporizer or shower). °· Eating chicken soup or other clear broths, and maintaining good nutrition. °· Getting plenty of rest. °· Using gargles or lozenges for comfort. °· Controlling fevers with ibuprofen or acetaminophen as directed by your caregiver. °· Increasing usage of your inhaler if you have asthma. °Zinc gel and zinc lozenges, taken in the first 24 hours of the common cold, can shorten the duration and lessen the severity of symptoms. Pain medicines may help with fever, muscle aches, and throat pain. A variety of non-prescription medicines are available to treat congestion and runny nose. Your caregiver can make recommendations and may suggest nasal or lung inhalers for other symptoms.  °HOME CARE INSTRUCTIONS  °· Only take over-the-counter or prescription medicines for pain, discomfort, or fever as directed by your caregiver. °· Use a warm mist humidifier or inhale steam from a shower to increase air moisture. This may keep secretions moist and make it easier to breathe. °· Drink enough water and fluids to keep your urine clear or pale yellow. °· Rest as needed. °· Return to work when your temperature has returned to normal or as your caregiver advises. You may need to stay home longer to avoid infecting others. You can also use a face mask and careful hand washing to prevent spread of the virus. °SEEK MEDICAL CARE IF:  °· After the first few days, you feel you are getting worse rather than better. °· You need your caregiver's advice about medicines to control symptoms. °· You develop chills, worsening shortness of breath, or brown or red sputum. These may be  signs of pneumonia. °· You develop yellow or brown nasal discharge or pain in the face, especially when you bend forward. These may be signs of sinusitis. °· You develop a fever, swollen neck glands, pain with swallowing, or white areas in the back of your throat. These may be signs of strep throat. °SEEK IMMEDIATE MEDICAL CARE IF:  °· You have a fever. °· You develop severe or persistent headache, ear pain, sinus pain, or chest pain. °· You develop wheezing, a prolonged cough, cough up blood, or have a change in your usual mucus (if you have chronic lung disease). °· You develop sore muscles or a stiff neck. °Document Released: 08/06/2000 Document Revised: 05/05/2011 Document Reviewed: 05/18/2013 °ExitCare® Patient Information ©2015 [employer]. This information is not intended to replace advice given to you by your health care provider. Make sure you discuss any questions you have with your health care provider. ° °

## 2014-09-11 NOTE — ED Notes (Signed)
Pt endorsing more anxiety. MD notified. Reports drinking [employer] generic nyquil.

## 2015-03-10 ENCOUNTER — Emergency Department (HOSPITAL_COMMUNITY): Payer: BLUE CROSS/BLUE SHIELD

## 2015-03-10 ENCOUNTER — Emergency Department (HOSPITAL_COMMUNITY)
Admission: EM | Admit: 2015-03-10 | Discharge: 2015-03-10 | Disposition: A | Discharge status: Home / Self Care | Payer: BLUE CROSS/BLUE SHIELD | Attending: Emergency Medicine | Admitting: Emergency Medicine

## 2015-03-10 ENCOUNTER — Encounter (HOSPITAL_COMMUNITY): Payer: Self-pay | Admitting: Emergency Medicine

## 2015-03-10 DIAGNOSIS — Z79899 Other long term (current) drug therapy: Secondary | ICD-10-CM | POA: Insufficient documentation

## 2015-03-10 DIAGNOSIS — R079 Chest pain, unspecified: Secondary | ICD-10-CM | POA: Diagnosis present

## 2015-03-10 DIAGNOSIS — F17200 Nicotine dependence, unspecified, uncomplicated: Secondary | ICD-10-CM | POA: Diagnosis not present

## 2015-03-10 LAB — CBC
HCT: 41.2 % (ref 39.0–52.0)
Hemoglobin: 15 g/dL (ref 13.0–17.0)
MCH: 30.4 pg (ref 26.0–34.0)
MCHC: 36.4 g/dL — ABNORMAL HIGH (ref 30.0–36.0)
MCV: 83.4 fL (ref 78.0–100.0)
Platelets: 285 10*3/uL (ref 150–400)
RBC: 4.94 MIL/uL (ref 4.22–5.81)
RDW: 12.3 % (ref 11.5–15.5)
WBC: 7.3 10*3/uL (ref 4.0–10.5)

## 2015-03-10 LAB — BASIC METABOLIC PANEL
Anion gap: 10 (ref 5–15)
BUN: 9 mg/dL (ref 6–20)
CALCIUM: 9.5 mg/dL (ref 8.9–10.3)
CO2: 25 mmol/L (ref 22–32)
CREATININE: 0.92 mg/dL (ref 0.61–1.24)
Chloride: 105 mmol/L (ref 101–111)
GFR calc Af Amer: >60 mL/min (ref >60)
GLUCOSE: 104 mg/dL — AB (ref 65–99)
Potassium: 4.1 mmol/L (ref 3.5–5.1)
Sodium: 140 mmol/L (ref 135–145)

## 2015-03-10 LAB — I-STAT TROPONIN, ED: Troponin i, poc: 0 ng/mL (ref 0.00–0.08)

## 2015-03-10 MED ORDER — ALBUTEROL SULFATE HFA 108 (90 BASE) MCG/ACT IN AERS: 1.0000 | INHALATION_SPRAY | Freq: Four times a day (QID) | RESPIRATORY_TRACT | Status: DC | PRN

## 2015-03-10 NOTE — Discharge Instructions (Signed)
Rest, drink plenty of fluids Please follow up with your primary doctor in 3 days for discussion of your diagnoses and further evaluation after today's visit; if you do not have a primary care doctor use the resource guide provided to find one; Please return to the ER for any new or worsening symptoms, any additional concerns   Emergency Department Resource Guide 1) Find a Doctor and Pay Out of Pocket Although you won't have to find out who is covered by your insurance plan, it is a good idea to ask around and get recommendations. You will then need to call the office and see if the doctor you have chosen will accept you as a new patient and what types of options they offer for patients who are self-pay. Some doctors offer discounts or will set up payment plans for their patients who do not have insurance, but you will need to ask so you aren't surprised when you get to your appointment.  2) Contact Your Local Health Department Not all health departments have doctors that can see patients for sick visits, but many do, so it is worth a call to see if yours does. If you don't know where your local health department is, you can check in your phone book. The CDC also has a tool to help you locate your state's health department, and many state websites also have listings of all of their local health departments.  3) Find a Walk-in Clinic If your illness is not likely to be very severe or complicated, you may want to try a walk in clinic. These are popping up all over the country in pharmacies, drugstores, and shopping centers. They're usually staffed by nurse practitioners or physician assistants that have been trained to treat common illnesses and complaints. They're usually fairly quick and inexpensive. However, if you have serious medical issues or chronic medical problems, these are probably not your best option.  No Primary Care Doctor: - Call Health Connect at  [phone] - they can help you locate a  primary care doctor that  accepts your insurance, provides certain services, etc. - Physician Referral Service- [phone]  Chronic Pain Problems: Organization         Address  Phone   Notes  [facility]  [phone] Patients need to be referred by their primary care doctor.   Medication Assistance: Organization         Address  Phone   Notes  [facility] [address] [place], [place] [id number] [phone] --Must be a resident of [facility] -- Must have NO insurance coverage whatsoever (no Medicaid/ Medicare, etc.) -- The pt. MUST have a primary care doctor that directs their care regularly and follows them in the community   MedAssist  [phone]   [employer]  [phone]    Agencies that provide inexpensive medical care: Organization         Address  Phone   Notes  [facility]  [phone]   [facility]    [phone]   [facility] [address] [place], [place] [address] [phone]   [facility] [address], [place] [phone]   [facility]anned Parenthood    [phone]) [phone]   [facility]    [phone]   [facility] and [facility]  [address], [place] Phone:  [phone], Fax:  [phone] Hours of Operation:  9 am - 6 pm, M-F.  Also accepts Medicaid/Medicare and self-pay.  [facility] for [place], [address], [place] Phone: [phone], Fax: [phone]. Hours of Operation:  8:30 am - 5:30 pm, M-F.  Also accepts Medicaid and self-pay.  [facility] [address] [place] Phone: [phone]   [place], [place], [place] [phone], Ext. [phone] Mondays & Thursdays: 7-9 AM.  First 15 patients are seen on a first come, first serve basis.    [place]  Providers:  Organization         Address  Phone   Notes  [facility] [address], [place] [phone] Also accepts self-pay patients.  [facility] [address], [place]  [phone]   [place], [address], [place] [phone]   [facility] [address], [place] [phone]   Lucianne Lei [address], [place]   [phone] Only accepts [place] Access [place] patients after they have their name applied to their card.   Self-Pay (no insurance) in [facility]:  Organization         Address  Phone   Notes  Sickle Cell Patients, [facility] [place] [phone]   [facility] [place] [phone]   [facility] [place]  [place], [place], [place] [phone]   [facility]/Dr. Osei-Bonsu  [address], [place] or [address], [place] [phone] Phone number for both [place] and [place] locations is the same.  Urgent Medical and [facility] [address], [place] [phone]   [facility] [address] [place] or [address] [phone]   [facility] [address], [place] [phone], phone; [phone], fax Sees patients 1st and 3rd Saturday of every month.  Must not qualify for public or private insurance (i.e. Medicaid, Medicare, [place] Health Choice, Veterans' Benefits)  Household income should be no more than 200% of the poverty level The clinic cannot treat you if you are pregnant or think you are pregnant  Sexually transmitted diseases are not treated at the clinic.    Dental Care: Organization         Address  Phone  Notes  [facility] Department of [facility] [place] [phone] Accepts children up to age 38 who are enrolled in [place] or [place]; pregnant women with a Medicaid card; and children who have applied for Medicaid or [place] Health Choice, but were declined, whose parents can pay a reduced fee at time of service.  [facility] Department of [facility]  [address], [place] [phone] Accepts children up to age 17 who are enrolled in [place] or [place]; pregnant women with a Medicaid card; and children who have applied for Medicaid or [place] Health Choice, but were declined, whose parents can pay a reduced fee at time of service.  [facility] Access PROGRAM  [place] [phone] Patients are seen by appointment only. Walk-ins are not accepted. [place] will see patients 74 years of age and older. Monday - Tuesday (8am-5pm) Most Wednesdays (8:30-5pm) $30 per visit, cash only  [facility] Adult Dental Access PROGRAM  [address], [facility] [phone] Patients are seen by appointment only. Walk-ins are not accepted. [place] will see patients 50 years of age and older. One Wednesday Evening (Monthly: Volunteer Based).  $30 per visit, cash only  [place]  [phone] for adults; Children under age 23, call Graduate Pediatric Dentistry at [phone]. Children aged 20-14, please call [phone] to request a pediatric application.  Dental services are provided in all areas of dental care including fillings, crowns and bridges, complete and partial dentures, implants, gum treatment, root canals, and extractions. Preventive care is also provided. Treatment is provided to both adults and children. Patients are selected via a lottery and there is often a waiting list.   [facility] [address], [place]  [phone] [email or web address]   [facility]  [address] [place], [place] [phone], Ext. [phone] Second and Fourth Thursday of each month, opens at 6:30 AM; Clinic ends at 9 AM.  Patients are seen on a first-come first-served basis, and a limited number are seen during each clinic.   [facility]  [address] Hillard Danker [place], [place] [phone]   Eligibility Requirements You must have lived in [place], [place], or [place] counties for at least the last three months.   You cannot be eligible for state or federal sponsored [employer], including [employer], [place], or [employer].   You generally cannot be eligible for healthcare insurance through your employer.    How to apply: Eligibility screenings are held every Tuesday and Wednesday afternoon from 1:00 pm until 4:00 pm. You do not need an appointment for the interview!  [facility] [address], [place], [place]   [place]  [place] Department  [place]  [phone]    Behavioral Health Resources in the Community: Intensive Outpatient Programs Organization         Address  Phone  Notes  [place]. [address] [place], [place] [phone]   [facility] Outpatient [address] [place], [place]   ADS: Alcohol & Drug Svcs [address], [place], [place]   [place] [address]  [place], [place] or [phone]   Substance Abuse Resources Organization         Address  Phone  Notes  Alcohol and Drug Services  [phone]   [place]  [phone]   The [place]   Chinita Pester  [phone]   Residential & Outpatient Substance Abuse Program  [phone]   Psychological Services Organization         Address  Phone  Notes  [facility] [place]  [place]  [phone]   [place] [address] [place] or [phone]    Mobile Crisis Teams Organization         Address  Phone  Notes  [facility], Mobile Crisis Care Unit  [phone]   [facility]ive Psychotherapeutic Services  [address] [place], [place]   [facility] [address] [place] [phone]    Self-Help/Support Groups Organization  Address  Phone             Notes  [place]. of [place] - variety of support groups  [place] Call for more information  Narcotics Anonymous (NA), [facility] [address], [employer] [place]  2 meetings at this location   Special educational needs teacher         Address  Phone  Notes  ASAP Residential Treatment [place],    [place]  [phone]   [facility]  [address], [place] 329924, [place], [place]   [place], [place] [phone] Admissions: 8am-3pm M-F  Incentives Substance [place] [address],    [place], [place] [phone]   [facility] [address] [place], [place], [place]   [facility] [address],  [place], [place]   [facility] - Intensive Outpatient [address], [place], [place]   [facility] [address]  [place], [place] [phone] or [phone]   [facility]idential Treatment Services (RTS) [address] [place], [place] Accepts Medicaid  [facility] [place] [address]  [place] [phone] Substance Abuse/Addiction Treatment   [facility] Organization         Address  Phone  Notes  [facility]  [phone]   Domenic Schwab, PhD [address] Arlis [place], [place]   [phone] or [phone]    [place], [place] [phone]   [facility] [address], [place], [place] [phone] Insurance/Medicaid/sponsorship through [facility] and Families [address], [address]                                    [place], [place] [phone] [place] [address], [place] [phone]    Dr. Adele Schilder  [phone]   [facility] Dept. 1) [address], [place] 2) [place] 3)  [place] [address], [place] [phone]  [phone]   [place] [phone] or [phone] (After Hours)

## 2015-03-10 NOTE — ED Notes (Signed)
Patient here with acute chest pain which began tonight while working. States history of similar, but has never been evaluated. Works in [employer]. States heavy lifting and working in cold may have exacerbated.

## 2015-03-10 NOTE — ED Provider Notes (Signed)
CSN: [id number]     Arrival date & time 03/10/15  4098 History   First MD Initiated Contact with Patient 03/10/15 [phone]     Chief Complaint  Patient presents with  . Chest Pain     (Consider location/radiation/quality/duration/timing/severity/associated sxs/prior Treatment) Patient is a 25 y.o. male presenting with chest pain. The history is provided by the patient and medical records. No language interpreter was used.  Chest Pain Associated symptoms: no abdominal pain, no back pain, no cough, no dizziness, no nausea, no numbness, no palpitations, no shortness of breath, not vomiting and no weakness    Mark Johnston is a 25 y.o. male  with PMH of asthma as a child, who presents to the Emergency Department complaining of sharp, intermittent, non-radiating, left-sided chest pain that began at approx. 9pm last night. Pain lasts 2-3 seconds. No alleviating or aggravating factors noted. No known trigger. Patients states he has experienced similar chest pain every 2-3 months over the last year. Pain occurred while patient was at work - states he does heavy lifting and works in the [employer] which is kept at around 30 degrees F. Patient states pain often occurs while working in cold conditions. At present, patient is not experiencing chest pain, stating it resolved approx. 1 hour ago. Denies shortness of breath, abdominal pain. Occasional smoker, states he smokes 2-3 cigarettes a week if on break with friends at work.   History reviewed. No pertinent past medical history. History reviewed. No pertinent past surgical history. History reviewed. No pertinent family history. Social History  Substance Use Topics  . Smoking status: Current Some Day Smoker -- 0.10 packs/day  . Smokeless tobacco: None  . Alcohol Use: No    Review of Systems  Constitutional: Negative.   HENT: Negative for congestion and rhinorrhea.   Eyes: Negative for visual disturbance.  Respiratory: Negative for cough,  shortness of breath and wheezing.   Cardiovascular: Positive for chest pain. Negative for palpitations and leg swelling.  Gastrointestinal: Negative for nausea, vomiting, abdominal pain, diarrhea and constipation.  Musculoskeletal: Negative for myalgias, back pain, arthralgias and neck pain.  Skin: Negative for rash.  Allergic/Immunologic: Negative for immunocompromised state.  Neurological: Negative for dizziness, syncope, weakness and numbness.      Allergies  Review of patient's allergies indicates no known allergies.  Home Medications   Prior to Admission medications   Medication Sig Start Date End Date Taking? Authorizing Provider  albuterol (PROVENTIL HFA;VENTOLIN HFA) 108 (90 Base) MCG/ACT inhaler Inhale 1-2 puffs into the lungs every 6 (six) hours as needed for wheezing or shortness of breath. 03/10/15   Jaime Pilcher Ward, PA-C  famotidine (PEPCID) 20 MG tablet Take 1 tablet (20 mg total) by mouth 2 (two) times daily. Patient not taking: Reported on 06/13/2014 10/14/13   Felicie Morn, NP  hydrOXYzine (ATARAX/VISTARIL) 10 MG tablet Take 1 tablet (10 mg total) by mouth 3 (three) times daily as needed for itching. Patient not taking: Reported on 06/13/2014 10/14/13   Felicie Morn, NP  naproxen (NAPROSYN) 500 MG tablet Take 1 tablet (500 mg total) by mouth 2 (two) times daily. Patient not taking: Reported on 09/11/2014 06/13/14   Harle Battiest, NP  ondansetron (ZOFRAN) 4 MG tablet Take 1 tablet (4 mg total) by mouth every 6 (six) hours. Patient not taking: Reported on 09/11/2014 06/13/14   Harle Battiest, NP  predniSONE (DELTASONE) 10 MG tablet Take 2 tablets (20 mg total) by mouth daily. Patient not taking: Reported on 06/13/2014 10/14/13   Onalee Hua  Katrinka BlazingSmith, NP  sodium chloride (OCEAN) 0.65 % SOLN nasal spray Place 1 spray into both nostrils as needed for congestion. 09/11/14   Marisa Severinlga Otter, MD   BP 120/75 mmHg  Pulse 80  Temp(Src) 97.8 F (36.6 C) (Oral)  Resp 18  SpO2 98% Physical  Exam  Constitutional: He is oriented to person, place, and time. He appears well-developed and well-nourished.  Alert and in no acute distress  HENT:  Head: Normocephalic and atraumatic.  Cardiovascular: Normal rate, regular rhythm, normal heart sounds and intact distal pulses.  Exam reveals no gallop and no friction rub.   No murmur heard. Pulmonary/Chest: Effort normal and breath sounds normal. No respiratory distress. He has no wheezes. He has no rales. He exhibits no tenderness.  Abdominal: He exhibits no mass. There is no rebound and no guarding.  Abdomen soft, non-tender, non-distended Bowel sounds positive in all four quadrants  Musculoskeletal: He exhibits no edema.  Neurological: He is alert and oriented to person, place, and time. No cranial nerve deficit.  Skin: Skin is warm and dry. No rash noted. He is not diaphoretic.  Psychiatric: He has a normal mood and affect. His behavior is normal. Judgment and thought content normal.  Nursing note and vitals reviewed.   ED Course  Procedures (including critical care time) Labs Review Labs Reviewed  BASIC METABOLIC PANEL - Abnormal; Notable for the following:    Glucose, Bld 104 (*)    All other components within normal limits  CBC - Abnormal; Notable for the following:    MCHC 36.4 (*)    All other components within normal limits  I-STAT TROPOININ, ED    Imaging Review Dg Chest 2 View  03/10/2015  CLINICAL DATA:  Left-sided chest pain since last night. EXAM: CHEST  2 VIEW COMPARISON:  03/19/2013 FINDINGS: The heart size and mediastinal contours are within normal limits. Both lungs are clear. The visualized skeletal structures are unremarkable. IMPRESSION: No active cardiopulmonary disease. Electronically Signed   By: Elberta Fortisaniel  Boyle M.D.   On: 03/10/2015 07:21   I have personally reviewed and evaluated these images and lab results as part of my medical decision-making.   EKG Interpretation   Date/Time:  Saturday March 10 2015 05:38:39 EST Ventricular Rate:  73 PR Interval:  190 QRS Duration: 96 QT Interval:  366 QTC Calculation: 403 R Axis:   44 Text Interpretation:  Normal sinus rhythm with sinus arrhythmia Normal ECG  No significant change since last tracing Reconfirmed by [facility]  MD,  Alphonzo LemmingsWHITNEY ([id number]) on 03/10/2015 7:36:50 AM      MDM   Final diagnoses:  Chest pain, unspecified chest pain type   Eileen Stanfordichard Goldman presents with chest pain which has intermittently occurred over the last year, often in cold environment. EKG reviewed, Troponin of 0.0, CXR with no acute abnormalities, CBC and BMP reassuring. PERC negative, Heart score of 0. Low risk for acute cardiopulm. etiology. Patient currently experiencing no pain.   A&P: Chest pain  - PCP follow up encouraged, resource guide given  - Albuterol inhaler  - Return precautions given.   Patient seen by and discussed with Dr. Anitra LauthPlunkett who agrees with treatment plan.  [facility] LLC[facility]ime Pilcher Ward, PA-C 03/10/15 [id number]  Gwyneth SproutWhitney Plunkett, MD 03/10/15 1003

## 2015-04-22 ENCOUNTER — Encounter: Payer: Self-pay | Admitting: *Deleted

## 2015-04-22 ENCOUNTER — Emergency Department
Admission: EM | Admit: 2015-04-22 | Discharge: 2015-04-22 | Disposition: A | Discharge status: Home / Self Care | Payer: BLUE CROSS/BLUE SHIELD | Attending: Emergency Medicine | Admitting: Emergency Medicine

## 2015-04-22 DIAGNOSIS — R51 Headache: Secondary | ICD-10-CM | POA: Diagnosis present

## 2015-04-22 DIAGNOSIS — F17200 Nicotine dependence, unspecified, uncomplicated: Secondary | ICD-10-CM | POA: Insufficient documentation

## 2015-04-22 DIAGNOSIS — J0120 Acute ethmoidal sinusitis, unspecified: Secondary | ICD-10-CM | POA: Diagnosis not present

## 2015-04-22 DIAGNOSIS — R519 Headache, unspecified: Secondary | ICD-10-CM

## 2015-04-22 MED ORDER — FEXOFENADINE-PSEUDOEPHED ER 60-120 MG PO TB12: 1.0000 | ORAL_TABLET | Freq: Two times a day (BID) | ORAL | Status: DC

## 2015-04-22 MED ORDER — AMOXICILLIN 500 MG PO CAPS: 500.0000 mg | ORAL_CAPSULE | Freq: Three times a day (TID) | ORAL | Status: DC

## 2015-04-22 MED ORDER — IBUPROFEN 600 MG PO TABS: 600.0000 mg | ORAL_TABLET | Freq: Three times a day (TID) | ORAL | Status: DC | PRN

## 2015-04-22 NOTE — ED Provider Notes (Signed)
[facility] Emergency Department Provider Note  ____________________________________________  Time seen: Approximately 6:05 PM  I have reviewed the triage vital signs and the nursing notes.   HISTORY  Chief Complaint Facial Pain and Nasal Congestion    HPI Mark Johnston is a 25 y.o. male patient complained of frontal headache, sinus congestion, cough sore throat. Patient state complaint has increased over the last 5 days. Patient also complaining of nausea but denies vomiting or diarrhea.No palliative measures taken for this complaint. Patient rates his pain discomfort as a 5/10. Describes pain as pressure sensation. Denies vertigo or hearing loss.   No past medical history on file.  There are no active problems to display for this patient.   No past surgical history on file.  Current Outpatient Rx  Name  Route  Sig  Dispense  Refill  . albuterol (PROVENTIL HFA;VENTOLIN HFA) 108 (90 Base) MCG/ACT inhaler   Inhalation   Inhale 1-2 puffs into the lungs every 6 (six) hours as needed for wheezing or shortness of breath.   1 Inhaler   0   . amoxicillin (AMOXIL) 500 MG capsule   Oral   Take 1 capsule (500 mg total) by mouth 3 (three) times daily.   30 capsule   0   . famotidine (PEPCID) 20 MG tablet   Oral   Take 1 tablet (20 mg total) by mouth 2 (two) times daily. Patient not taking: Reported on 06/13/2014   10 tablet   0   . fexofenadine-pseudoephedrine (ALLEGRA-D) 60-120 MG 12 hr tablet   Oral   Take 1 tablet by mouth 2 (two) times daily.   30 tablet   0   . hydrOXYzine (ATARAX/VISTARIL) 10 MG tablet   Oral   Take 1 tablet (10 mg total) by mouth 3 (three) times daily as needed for itching. Patient not taking: Reported on 06/13/2014   15 tablet   0   . ibuprofen (ADVIL,MOTRIN) 600 MG tablet   Oral   Take 1 tablet (600 mg total) by mouth every 8 (eight) hours as needed.   15 tablet   0   . naproxen (NAPROSYN) 500 MG tablet   Oral  Take 1 tablet (500 mg total) by mouth 2 (two) times daily. Patient not taking: Reported on 09/11/2014   30 tablet   0   . ondansetron (ZOFRAN) 4 MG tablet   Oral   Take 1 tablet (4 mg total) by mouth every 6 (six) hours. Patient not taking: Reported on 09/11/2014   12 tablet   0   . predniSONE (DELTASONE) 10 MG tablet   Oral   Take 2 tablets (20 mg total) by mouth daily. Patient not taking: Reported on 06/13/2014   10 tablet   0   . sodium chloride (OCEAN) 0.65 % SOLN nasal spray   Each Nare   Place 1 spray into both nostrils as needed for congestion.   30 mL   0     Allergies Review of patient's allergies indicates no known allergies.  No family history on file.  Social History Social History  Substance Use Topics  . Smoking status: Current Some Day Smoker -- 0.10 packs/day  . Smokeless tobacco: None  . Alcohol Use: No    Review of Systems Constitutional: No fever/chills Eyes: No visual changes. ENT: Sore throat and sinus pressure. Patient say thick greenish nasal discharge.. Cardiovascular: Denies chest pain. Respiratory: Denies shortness of breath. Gastrointestinal: No abdominal pain. States nausea, no vomiting.  No  diarrhea.  No constipation. Genitourinary: Negative for dysuria. Musculoskeletal: Negative for back pain. Skin: Negative for rash. Neurological: Negative for headaches, focal weakness or numbness.   ____________________________________________   PHYSICAL EXAM:  VITAL SIGNS: ED Triage Vitals  Enc Vitals Group     BP 04/22/15 1718 130/61 mmHg     Pulse Rate 04/22/15 1718 65     Resp 04/22/15 1718 18     Temp 04/22/15 1718 98.5 F (36.9 C)     Temp Source 04/22/15 1718 Oral     SpO2 04/22/15 1718 100 %     Weight 04/22/15 1718 230 lb (104.327 kg)     Height 04/22/15 1718  (1.676 m)     Head Cir --      Peak Flow --      Pain Score 04/22/15 1718 5     Pain Loc --      Pain Edu? --      Excl. in GC? --     Constitutional: Alert  and oriented. Well appearing and in no acute distress. Eyes: Conjunctivae are normal. PERRL. EOMI. Head: Atraumatic. Nose: Bilateral frontal and maxillary sinus guarding. Edematous bilateral nasal turbinates. Thick nasal discharge. Mouth/Throat: Mucous membranes are moist.  Oropharynx non-erythematous. Post nasal drainage. Neck: No stridor. No cervical spine tenderness to palpation. Hematological/Lymphatic/Immunilogical: No cervical lymphadenopathy. Cardiovascular: Normal rate, regular rhythm. Grossly normal heart sounds.  Good peripheral circulation. Respiratory: Normal respiratory effort.  No retractions. Lungs CTAB. Gastrointestinal: Soft and nontender. No distention. No abdominal bruits. No CVA tenderness. Musculoskeletal: No lower extremity tenderness nor edema.  No joint effusions. Neurologic:  Normal speech and language. No gross focal neurologic deficits are appreciated. No gait instability. Skin:  Skin is warm, dry and intact. No rash noted. Psychiatric: Mood and affect are normal. Speech and behavior are normal.  ____________________________________________   LABS (all labs ordered are listed, but only abnormal results are displayed)  Labs Reviewed - No data to display ____________________________________________  EKG   ____________________________________________  RADIOLOGY   ____________________________________________   PROCEDURES  Procedure(s) performed: None  Critical Care performed: No  ____________________________________________   INITIAL IMPRESSION / ASSESSMENT AND PLAN / ED COURSE  Pertinent labs & imaging results that were available during my care of the patient were reviewed by me and considered in my medical decision making (see chart for details).  Sinusitis with sinus headache. Patient given discharge care instructions. Given prescription for amoxicillin, Allegra-D, and ibuprofen. He given a work note for one day. Advised to [facility]. ____________________________________________   FINAL CLINICAL IMPRESSION(S) / ED DIAGNOSES  Final diagnoses:  Subacute ethmoidal sinusitis  Sinus headache      Joni Reining, PA-C 04/22/15 1816  Minna Antis, MD 04/22/15 2255

## 2015-04-22 NOTE — ED Notes (Signed)
AAOx3.  Skin warm and dry. NAD.  Ambulates with easy and steady gait.   

## 2015-04-22 NOTE — ED Notes (Signed)
Pt to triage via wheelchair.  Pt reports headache, sinus congestion, cough, sore throat.  Sx for several days.  Pt alert.   Speech clear.

## 2016-05-13 ENCOUNTER — Encounter (HOSPITAL_COMMUNITY): Payer: Self-pay | Admitting: Emergency Medicine

## 2016-05-13 ENCOUNTER — Emergency Department (HOSPITAL_COMMUNITY)
Admission: EM | Admit: 2016-05-13 | Discharge: 2016-05-13 | Disposition: A | Discharge status: Home / Self Care | Payer: No Typology Code available for payment source | Attending: Emergency Medicine | Admitting: Emergency Medicine

## 2016-05-13 DIAGNOSIS — J0100 Acute maxillary sinusitis, unspecified: Secondary | ICD-10-CM

## 2016-05-13 DIAGNOSIS — F17200 Nicotine dependence, unspecified, uncomplicated: Secondary | ICD-10-CM | POA: Insufficient documentation

## 2016-05-13 MED ORDER — FLUTICASONE PROPIONATE 50 MCG/ACT NA SUSP: 2.0000 | Freq: Every day | NASAL | 2 refills | Status: DC

## 2016-05-13 MED ORDER — DOXYCYCLINE HYCLATE 100 MG PO CAPS: 100.0000 mg | ORAL_CAPSULE | Freq: Two times a day (BID) | ORAL | 0 refills | Status: DC

## 2016-05-13 NOTE — ED Notes (Signed)
c/o nasal congestion, sinus pressure x 2 weeks.

## 2016-05-13 NOTE — Discharge Instructions (Signed)
Begin taking doxycycline twice daily for 10 days. Begin taking Flonase 2 sprays in each nostril. Follow up with dentist if dental pain does not improve. Return to ED for trouble breathing, trouble swallowing or chest pain or fever develops.

## 2016-05-13 NOTE — ED Triage Notes (Signed)
Pt sts nasal congestion and left sided dental pain

## 2016-05-13 NOTE — ED Provider Notes (Signed)
[facility]-EMERGENCY DEPT Provider Note    By signing my name below, I, Earmon Phoenix, attest that this documentation has been prepared under the direction and in the presence of Xzavian Semmel, PA-C. Electronically Signed: Earmon Phoenix, ED Scribe. 05/13/16. 6:11 PM.    History   Chief Complaint Chief Complaint  Patient presents with  . Nasal Congestion  . Dental Pain    The history is provided by the patient and medical records. No language interpreter was used.    Mark Johnston is a 26 y.o. male who presents to the Emergency Department complaining of maxillary sinus pressure that began approx 1.5 week ago. It is associated with constant upper left dental pain that began two days ago upon waking. He also reports productive cough of clear/yellow/green mucous, bilateral otalgia (R>L) and HA. He has taken Mucinex with mild relief. There are no modifying factors reported. He denies rhinorrhea, fever, chills, sore throat, nausea, vomiting, diarrhea, constipation, drooling, difficulty swallowing or breathing, trismus. He denies any trauma to the teeth. He states he last went to the dentist last year and was d/c with a normal exam. He denies h/o seasonal allergies or asthma. He states he is an occasional smoker. He states he is allergic to amoxicillin but is unsure of his reaction.   History reviewed. No pertinent past medical history.  There are no active problems to display for this patient.   History reviewed. No pertinent surgical history.     Home Medications    Prior to Admission medications   Medication Sig Start Date End Date Taking? Authorizing Provider  albuterol (PROVENTIL HFA;VENTOLIN HFA) 108 (90 Base) MCG/ACT inhaler Inhale 1-2 puffs into the lungs every 6 (six) hours as needed for wheezing or shortness of breath. 03/10/15   Chase Picket Ward, PA-C  amoxicillin (AMOXIL) 500 MG capsule Take 1 capsule (500 mg total) by mouth 3 (three) times daily. 04/22/15   Joni Reining,  PA-C  doxycycline (VIBRAMYCIN) 100 MG capsule Take 1 capsule (100 mg total) by mouth 2 (two) times daily. 05/13/16   Bailie Christenbury, PA  famotidine (PEPCID) 20 MG tablet Take 1 tablet (20 mg total) by mouth 2 (two) times daily. Patient not taking: Reported on 06/13/2014 10/14/13   Felicie Morn, NP  fexofenadine-pseudoephedrine (ALLEGRA-D) 60-120 MG 12 hr tablet Take 1 tablet by mouth 2 (two) times daily. 04/22/15   Joni Reining, PA-C  fluticasone (FLONASE) 50 MCG/ACT nasal spray Place 2 sprays into both nostrils daily. 05/13/16   Tzivia Oneil, PA  hydrOXYzine (ATARAX/VISTARIL) 10 MG tablet Take 1 tablet (10 mg total) by mouth 3 (three) times daily as needed for itching. Patient not taking: Reported on 06/13/2014 10/14/13   Felicie Morn, NP  ibuprofen (ADVIL,MOTRIN) 600 MG tablet Take 1 tablet (600 mg total) by mouth every 8 (eight) hours as needed. 04/22/15   Joni Reining, PA-C  naproxen (NAPROSYN) 500 MG tablet Take 1 tablet (500 mg total) by mouth 2 (two) times daily. Patient not taking: Reported on 09/11/2014 06/13/14   Harle Battiest, NP  ondansetron (ZOFRAN) 4 MG tablet Take 1 tablet (4 mg total) by mouth every 6 (six) hours. Patient not taking: Reported on 09/11/2014 06/13/14   Harle Battiest, NP  predniSONE (DELTASONE) 10 MG tablet Take 2 tablets (20 mg total) by mouth daily. Patient not taking: Reported on 06/13/2014 10/14/13   Felicie Morn, NP  sodium chloride (OCEAN) 0.65 % SOLN nasal spray Place 1 spray into both nostrils as needed for congestion. 09/11/14  Marisa Severinlga Otter, MD    Family History History reviewed. No pertinent family history.  Social History Social History  Substance Use Topics  . Smoking status: Current Some Day Smoker    Packs/day: 0.10  . Smokeless tobacco: Not on file  . Alcohol use No     Allergies   Penicillins   Review of Systems Review of Systems  Constitutional: Negative for chills, fatigue and fever.  HENT: Positive for congestion, dental problem, ear pain  and sinus pressure. Negative for drooling, rhinorrhea, sore throat and trouble swallowing.   Eyes: Negative for visual disturbance.  Respiratory: Positive for cough. Negative for chest tightness and shortness of breath.   Cardiovascular: Negative for chest pain.  Gastrointestinal: Negative for abdominal pain, constipation, diarrhea, nausea and vomiting.  Genitourinary: Negative for dysuria.  Musculoskeletal: Negative for myalgias.  Skin: Negative for rash.  Neurological: Positive for headaches. Negative for light-headedness.  All other systems reviewed and are negative.    Physical Exam Updated Vital Signs BP (!) 139/96 (BP Location: Right Arm)   Pulse 73   Temp 98.9 F (37.2 C) (Oral)   Resp 18   SpO2 100%   Physical Exam  Constitutional: He is oriented to person, place, and time. He appears well-developed and well-nourished. No distress.  HENT:  Head: Normocephalic and atraumatic.  Right Ear: External ear and ear canal normal. Tympanic membrane is erythematous. A middle ear effusion is present.  Left Ear: Tympanic membrane, external ear and ear canal normal.  Nose: Right sinus exhibits maxillary sinus tenderness. Left sinus exhibits maxillary sinus tenderness.  Mouth/Throat: Uvula is midline, oropharynx is clear and moist and mucous membranes are normal. No oropharyngeal exudate. No tonsillar exudate.  Eyes: Conjunctivae are normal. No scleral icterus.  Neck: Normal range of motion.  Cardiovascular: Normal rate, regular rhythm and normal heart sounds.  Exam reveals no gallop and no friction rub.   No murmur heard. Pulmonary/Chest: Effort normal and breath sounds normal. No respiratory distress. He has no wheezes. He has no rales.  Abdominal: Soft. Bowel sounds are normal. There is no tenderness.  Musculoskeletal: Normal range of motion.  Lymphadenopathy:    He has no cervical adenopathy.  Neurological: He is alert and oriented to person, place, and time.  Skin: Skin is warm  and dry.  Psychiatric: He has a normal mood and affect. His behavior is normal.  Nursing note and vitals reviewed.    ED Treatments / Results  DIAGNOSTIC STUDIES: Oxygen Saturation is 100% on RA, normal by my interpretation.   COORDINATION OF CARE: 6:07 PM- Will prescribe antibiotic for ear infection and treat other symptoms symptomatically. Encouraged pt to follow up with dentist. Pt verbalizes understanding and agrees to plan.  Medications - No data to display  Labs (all labs ordered are listed, but only abnormal results are displayed) Labs Reviewed - No data to display  EKG  EKG Interpretation None       Radiology No results found.  Procedures Procedures (including critical care time)  Medications Ordered in ED Medications - No data to display   Initial Impression / Assessment and Plan / ED Course  I have reviewed the triage vital signs and the nursing notes.  Pertinent labs & imaging results that were available during my care of the patient were reviewed by me and considered in my medical decision making (see chart for details).     Patient's history and symptoms are concerning for sinusitis and otitis media. Due to symptoms being present for  1.5 weeks and sputum production present, will treat with antibiotic. No signs of any dental abscess or trauma to this area. This dental pain could be due to the sinus pressure that has been present. PCN allergic so will give Doxycyline. Would also benefit from nasal spray. Patient educated on continuing supportive care as needed. Try Tylenol or ibuprofen for dental pain. Follow up with dentist if the pain does not improve. Return precautions are given. Patient agreed to plan.  I personally performed the services described in this documentation, which was scribed in my presence. The recorded information has been reviewed and is accurate.   Final Clinical Impressions(s) / ED Diagnoses   Final diagnoses:  Acute  non-recurrent maxillary sinusitis    New Prescriptions Discharge Medication List as of 05/13/2016  6:33 PM    START taking these medications   Details  doxycycline (VIBRAMYCIN) 100 MG capsule Take 1 capsule (100 mg total) by mouth 2 (two) times daily., Starting Tue 05/13/2016, Print    fluticasone (FLONASE) 50 MCG/ACT nasal spray Place 2 sprays into both nostrils daily., Starting Tue 05/13/2016, Print         Vikrant Pryce [place], [place] 05/14/16 0000    Marily Memos, MD 05/15/16 [phone]

## 2016-06-01 ENCOUNTER — Emergency Department (HOSPITAL_COMMUNITY)
Admission: EM | Admit: 2016-06-01 | Discharge: 2016-06-01 | Disposition: A | Discharge status: Home / Self Care | Payer: Self-pay | Attending: Emergency Medicine | Admitting: Emergency Medicine

## 2016-06-01 ENCOUNTER — Encounter (HOSPITAL_COMMUNITY): Payer: Self-pay

## 2016-06-01 ENCOUNTER — Emergency Department (HOSPITAL_COMMUNITY): Payer: Self-pay

## 2016-06-01 DIAGNOSIS — F17200 Nicotine dependence, unspecified, uncomplicated: Secondary | ICD-10-CM | POA: Insufficient documentation

## 2016-06-01 DIAGNOSIS — K0889 Other specified disorders of teeth and supporting structures: Secondary | ICD-10-CM | POA: Insufficient documentation

## 2016-06-01 DIAGNOSIS — R0789 Other chest pain: Secondary | ICD-10-CM | POA: Insufficient documentation

## 2016-06-01 LAB — BASIC METABOLIC PANEL
ANION GAP: 6 (ref 5–15)
Anion gap: 8 (ref 5–15)
BUN: 9 mg/dL (ref 6–20)
BUN: 8 mg/dL (ref 6–20)
CALCIUM: 9.6 mg/dL (ref 8.9–10.3)
CHLORIDE: 105 mmol/L (ref 101–111)
CO2: 27 mmol/L (ref 22–32)
CO2: 27 mmol/L (ref 22–32)
CREATININE: 1.02 mg/dL (ref 0.61–1.24)
CREATININE: 1.09 mg/dL (ref 0.61–1.24)
Calcium: 9.7 mg/dL (ref 8.9–10.3)
Chloride: 100 mmol/L — ABNORMAL LOW (ref 101–111)
GFR calc Af Amer: >60 mL/min (ref >60)
GFR calc non Af Amer: >60 mL/min (ref >60)
GFR calc non Af Amer: >60 mL/min (ref >60)
GLUCOSE: 91 mg/dL (ref 65–99)
Glucose, Bld: 91 mg/dL (ref 65–99)
Potassium: 7 mmol/L (ref 3.5–5.1)
Potassium: 4.4 mmol/L (ref 3.5–5.1)
Sodium: 135 mmol/L (ref 135–145)
Sodium: 138 mmol/L (ref 135–145)

## 2016-06-01 LAB — CBC
HCT: 44.1 % (ref 39.0–52.0)
Hemoglobin: 15.7 g/dL (ref 13.0–17.0)
MCH: 29.6 pg (ref 26.0–34.0)
MCHC: 35.6 g/dL (ref 30.0–36.0)
MCV: 83.1 fL (ref 78.0–100.0)
PLATELETS: 347 10*3/uL (ref 150–400)
RBC: 5.31 MIL/uL (ref 4.22–5.81)
RDW: 12.4 % (ref 11.5–15.5)
WBC: 6.4 10*3/uL (ref 4.0–10.5)

## 2016-06-01 LAB — I-STAT TROPONIN, ED: Troponin i, poc: 0 ng/mL (ref 0.00–0.08)

## 2016-06-01 MED ORDER — CYCLOBENZAPRINE HCL 10 MG PO TABS: 10.0000 mg | ORAL_TABLET | Freq: Two times a day (BID) | ORAL | 0 refills | Status: DC | PRN

## 2016-06-01 NOTE — ED Triage Notes (Signed)
ON arrival to room C-26 Pt reported he had dull Lt sided CP that radiates into Lt shoulder and lower jaw pain. Pt reports nothing makes it worse or better. Pt ambulatory to room.

## 2016-06-01 NOTE — ED Provider Notes (Signed)
[facility]-EMERGENCY DEPT Provider Note   CSN: [id number] Arrival date & time: 06/01/16  0935    By signing my name below, I, Valentino Saxon, attest that this documentation has been prepared under the direction and in the presence of Altagracia Rone, PA-C. Electronically Signed: Valentino Saxon, ED Scribe. 06/01/16. 12:05 PM.  History   Chief Complaint Chief Complaint  Patient presents with  . cough, congestion    The history is provided by the patient. No language interpreter was used.   HPI Comments: Mick Tanguma is a 26 y.o. male who presents to the Emergency Department complaining of dull, left-sided chest pain and intermittent, sharp left shoulder pain that radiates down his arm. He report the dull pain has been constant, but waxes and wanes.  Pt notes his sharp shoulder pain typically lasts ~15-20 seconds at time. He states nothing aggravates or alleviates his symptoms. Symptoms were treated at home with 800 mg of ibuprofen tablets with no relief. No recent trauma or injury. He denies fever, chills, abdominal pain, nausea, vomiting, rash, back pain, SOB, or leg swelling  Per pt, he reports having a productive cough. He reports the cough fully subsided two days ago, but has since returned along with brown sputum.   He also reports 7/10, gradual onset, constant, lower jaw pain that occurred yesterday. Pt denies any trauma to teeth. Pt was last seen in the ED on 03/20 for dental pain and productive cough with mucous and treated with Doxycyline. .   No chronic medical problems. Pt also denies taking daily medications. Allergic to PCN.   History reviewed. No pertinent past medical history.  There are no active problems to display for this patient.   History reviewed. No pertinent surgical history.     Home Medications    Prior to Admission medications   Medication Sig Start Date End Date Taking? Authorizing Provider  albuterol (PROVENTIL HFA;VENTOLIN HFA) 108 (90 Base) MCG/ACT  inhaler Inhale 1-2 puffs into the lungs every 6 (six) hours as needed for wheezing or shortness of breath. 03/10/15   Chase Picket Ward, PA-C  amoxicillin (AMOXIL) 500 MG capsule Take 1 capsule (500 mg total) by mouth 3 (three) times daily. 04/22/15   Joni Reining, PA-C  cyclobenzaprine (FLEXERIL) 10 MG tablet Take 1 tablet (10 mg total) by mouth 2 (two) times daily as needed for muscle spasms. 06/01/16   Cervando Durnin A Amalia Edgecombe, PA-C  doxycycline (VIBRAMYCIN) 100 MG capsule Take 1 capsule (100 mg total) by mouth 2 (two) times daily. 05/13/16   Hina Khatri, PA-C  famotidine (PEPCID) 20 MG tablet Take 1 tablet (20 mg total) by mouth 2 (two) times daily. Patient not taking: Reported on 06/13/2014 10/14/13   Felicie Morn, NP  fexofenadine-pseudoephedrine (ALLEGRA-D) 60-120 MG 12 hr tablet Take 1 tablet by mouth 2 (two) times daily. 04/22/15   Joni Reining, PA-C  fluticasone (FLONASE) 50 MCG/ACT nasal spray Place 2 sprays into both nostrils daily. 05/13/16   Hina Khatri, PA-C  hydrOXYzine (ATARAX/VISTARIL) 10 MG tablet Take 1 tablet (10 mg total) by mouth 3 (three) times daily as needed for itching. Patient not taking: Reported on 06/13/2014 10/14/13   Felicie Morn, NP  ibuprofen (ADVIL,MOTRIN) 600 MG tablet Take 1 tablet (600 mg total) by mouth every 8 (eight) hours as needed. 04/22/15   Joni Reining, PA-C  naproxen (NAPROSYN) 500 MG tablet Take 1 tablet (500 mg total) by mouth 2 (two) times daily. Patient not taking: Reported on 09/11/2014 06/13/14   Harle Battiest,  NP  ondansetron (ZOFRAN) 4 MG tablet Take 1 tablet (4 mg total) by mouth every 6 (six) hours. Patient not taking: Reported on 09/11/2014 06/13/14   Harle Battiest, NP  predniSONE (DELTASONE) 10 MG tablet Take 2 tablets (20 mg total) by mouth daily. Patient not taking: Reported on 06/13/2014 10/14/13   Felicie Morn, NP  sodium chloride (OCEAN) 0.65 % SOLN nasal spray Place 1 spray into both nostrils as needed for congestion. 09/11/14   Marisa Severin, MD     Family History No family history on file.  Social History Social History  Substance Use Topics  . Smoking status: Current Some Day Smoker    Packs/day: 0.10  . Smokeless tobacco: Not on file  . Alcohol use No     Allergies   Penicillins   Review of Systems Review of Systems  Constitutional: Negative for chills and fever.  HENT: Positive for dental problem.   Eyes: Negative for visual disturbance.  Respiratory: Positive for cough. Negative for shortness of breath.   Cardiovascular: Positive for chest pain. Negative for leg swelling.  Gastrointestinal: Positive for diarrhea. Negative for abdominal pain, nausea and vomiting.  Genitourinary: Negative for dysuria.  Musculoskeletal: Positive for arthralgias. Negative for back pain.  Skin: Negative for rash.  Allergic/Immunologic: Negative for immunocompromised state.  Neurological: Negative for weakness, numbness and headaches.  Psychiatric/Behavioral: Negative for confusion.     Physical Exam Updated Vital Signs BP 131/78 (BP Location: Right Arm)   Pulse 76   Temp 98.6 F (37 C) (Oral)   Resp 16   SpO2 100%   Physical Exam  Constitutional: He appears well-developed and well-nourished.  HENT:  Head: Normocephalic and atraumatic.  Eyes: Conjunctivae are normal.  Neck: Neck supple.  Cardiovascular: Normal rate, regular rhythm, normal heart sounds and intact distal pulses.  Exam reveals no gallop and no friction rub.   No murmur heard. Pulmonary/Chest: Effort normal and breath sounds normal. No accessory muscle usage. No tachypnea. No respiratory distress. He has no decreased breath sounds. He has no wheezes. He has no rales. He exhibits no mass, no tenderness, no bony tenderness, no laceration, no crepitus, no edema and no swelling.  Abdominal: Soft. He exhibits no distension. There is no tenderness. There is no guarding.  Musculoskeletal: He exhibits no edema.  Neurological: He is alert.  Skin: Skin is warm and  dry.  Psychiatric: His behavior is normal.  Nursing note and vitals reviewed.    ED Treatments / Results   DIAGNOSTIC STUDIES: Oxygen Saturation is 100% on RA, normal by my interpretation.    COORDINATION OF CARE: 12:03 PM Discussed treatment plan with pt at bedside which includes labs, chest imaging, EKG and muscle relaxant and pt agreed to plan.   Labs (all labs ordered are listed, but only abnormal results are displayed) Labs Reviewed  BASIC METABOLIC PANEL - Abnormal; Notable for the following:       Result Value   Potassium 7.0 (*)    Chloride 100 (*)    All other components within normal limits  CBC  BASIC METABOLIC PANEL  I-STAT TROPOININ, ED    EKG  EKG Interpretation  Date/Time:  Sunday June 01 2016 09:50:52 EDT Ventricular Rate:  71 PR Interval:  168 QRS Duration: 94 QT Interval:  364 QTC Calculation: 395 R Axis:   45 Text Interpretation:  Normal sinus rhythm Normal ECG No significant change since last tracing Confirmed by YAO  MD, DAVID ([id number]) on 06/02/2016 9:43:11 PM  Radiology No results found.  Procedures Procedures (including critical care time)  Medications Ordered in ED Medications - No data to display   Initial Impression / Assessment and Plan / ED Course  I have reviewed the triage vital signs and the nursing notes.  Pertinent labs & imaging results that were available during my care of the patient were reviewed by me and considered in my medical decision making (see chart for details).     Patient is to be discharged with recommendation to follow up with PCP in regards to today's hospital visit. Chest pain is not likely of cardiac or pulmonary etiology d/t presentation, perc negative, VSS, no tracheal deviation, no JVD or new murmur, RRR, breath sounds equal bilaterally, EKG without acute abnormalities, negative troponin, and negative CXR. Pt has been advised to return to the ED is CP becomes exertional, associated with diaphoresis  or nausea, radiates to left jaw/arm, worsens or becomes concerning in any way. Pt appears reliable for follow up and is agreeable to discharge.   Final Clinical Impressions(s) / ED Diagnoses   Final diagnoses:  Left-sided chest wall pain  Pain, dental    New Prescriptions Discharge Medication List as of 06/01/2016 12:55 PM    START taking these medications   Details  cyclobenzaprine (FLEXERIL) 10 MG tablet Take 1 tablet (10 mg total) by mouth 2 (two) times daily as needed for muscle spasms., Starting Sun 06/01/2016, Print        I personally performed the services described in this documentation, which was scribed in my presence. The recorded information has been reviewed and is accurate.    Barkley Boards, PA-C 06/03/16 2312    Jerelyn Scott, MD 06/04/16 1534

## 2016-06-01 NOTE — Discharge Instructions (Signed)
Please return to the Emergency Department for new or worsening symptoms. If you call the number provided on your discharge paperwork, you can get established with a primary care provider.

## 2016-06-01 NOTE — ED Triage Notes (Signed)
Patient here with left sided dental pain, left shoulder pain. Had cough with brown mucus the past week. Alert and oriented, NAD

## 2016-06-01 NOTE — ED Notes (Signed)
Declined W/C at D/C and was escorted to lobby by RN. 

## 2017-04-12 ENCOUNTER — Emergency Department (HOSPITAL_COMMUNITY)
Admission: EM | Admit: 2017-04-12 | Discharge: 2017-04-12 | Disposition: A | Discharge status: Home / Self Care | Payer: Self-pay | Attending: Emergency Medicine | Admitting: Emergency Medicine

## 2017-04-12 ENCOUNTER — Encounter (HOSPITAL_COMMUNITY): Payer: Self-pay | Admitting: Emergency Medicine

## 2017-04-12 ENCOUNTER — Emergency Department (HOSPITAL_COMMUNITY): Payer: Self-pay

## 2017-04-12 ENCOUNTER — Other Ambulatory Visit: Payer: Self-pay

## 2017-04-12 DIAGNOSIS — I10 Essential (primary) hypertension: Secondary | ICD-10-CM | POA: Insufficient documentation

## 2017-04-12 DIAGNOSIS — Z87891 Personal history of nicotine dependence: Secondary | ICD-10-CM | POA: Insufficient documentation

## 2017-04-12 DIAGNOSIS — Y999 Unspecified external cause status: Secondary | ICD-10-CM | POA: Insufficient documentation

## 2017-04-12 DIAGNOSIS — M79602 Pain in left arm: Secondary | ICD-10-CM

## 2017-04-12 DIAGNOSIS — Y929 Unspecified place or not applicable: Secondary | ICD-10-CM | POA: Insufficient documentation

## 2017-04-12 DIAGNOSIS — Y939 Activity, unspecified: Secondary | ICD-10-CM | POA: Insufficient documentation

## 2017-04-12 DIAGNOSIS — Z79899 Other long term (current) drug therapy: Secondary | ICD-10-CM | POA: Insufficient documentation

## 2017-04-12 DIAGNOSIS — S39012A Strain of muscle, fascia and tendon of lower back, initial encounter: Secondary | ICD-10-CM | POA: Insufficient documentation

## 2017-04-12 MED ORDER — NAPROXEN 500 MG PO TABS: 500.0000 mg | ORAL_TABLET | Freq: Two times a day (BID) | ORAL | 0 refills | Status: DC
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
## 2017-04-12 NOTE — Discharge Instructions (Signed)
Take the Naprosyn as directed for the next 7 days to see if it helps with the back pain and the arm pain.  Follow-up with the wellness clinic to have your blood pressure rechecked.  Was high here today and he may require treatment for high blood pressure.  Return for any new or worse symptoms.
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
## 2019-05-20 ENCOUNTER — Emergency Department (HOSPITAL_COMMUNITY)
Admission: EM | Admit: 2019-05-20 | Discharge: 2019-05-20 | Disposition: A | Discharge status: Home / Self Care | Payer: No Typology Code available for payment source | Attending: Emergency Medicine | Admitting: Emergency Medicine

## 2019-05-20 ENCOUNTER — Other Ambulatory Visit: Payer: Self-pay

## 2019-05-20 ENCOUNTER — Encounter (HOSPITAL_COMMUNITY): Payer: Self-pay

## 2019-05-20 DIAGNOSIS — Z87891 Personal history of nicotine dependence: Secondary | ICD-10-CM | POA: Insufficient documentation

## 2019-05-20 DIAGNOSIS — Y92410 Unspecified street and highway as the place of occurrence of the external cause: Secondary | ICD-10-CM | POA: Insufficient documentation

## 2019-05-20 DIAGNOSIS — Z79899 Other long term (current) drug therapy: Secondary | ICD-10-CM | POA: Insufficient documentation

## 2019-05-20 DIAGNOSIS — Y93I9 Activity, other involving external motion: Secondary | ICD-10-CM | POA: Insufficient documentation

## 2019-05-20 DIAGNOSIS — S39012A Strain of muscle, fascia and tendon of lower back, initial encounter: Secondary | ICD-10-CM | POA: Insufficient documentation

## 2019-05-20 DIAGNOSIS — Y999 Unspecified external cause status: Secondary | ICD-10-CM | POA: Insufficient documentation

## 2019-05-20 MED ORDER — METHOCARBAMOL 500 MG PO TABS: 500.0000 mg | ORAL_TABLET | Freq: Two times a day (BID) | ORAL | 0 refills | Status: DC | PRN

## 2019-05-20 MED ORDER — NAPROXEN 500 MG PO TABS
500.0000 mg | ORAL_TABLET | Freq: Once | ORAL | Status: AC
  Administered 2019-05-20: 500 mg via ORAL
  Filled 2019-05-20: qty 1

## 2019-05-20 MED ORDER — METHOCARBAMOL 500 MG PO TABS
500.0000 mg | ORAL_TABLET | Freq: Once | ORAL | Status: AC
  Administered 2019-05-20: 500 mg via ORAL
  Filled 2019-05-20: qty 1

## 2019-05-20 MED ORDER — NAPROXEN 500 MG PO TABS: 500.0000 mg | ORAL_TABLET | Freq: Two times a day (BID) | ORAL | 0 refills | Status: DC | PRN
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
## 2019-05-20 NOTE — Discharge Instructions (Signed)
Alternate ice and heat to areas of injury 3-4 times per day to limit inflammation and spasm.  Avoid strenuous activity and heavy lifting.  We recommend consistent use of naproxen in addition to Robaxin for muscle spasms. Do not drive or drink alcohol after taking Robaxin as it may make you drowsy and impair your judgment.  We recommend follow-up with a primary care doctor to ensure resolution of symptoms.  Return to the ED for any new or concerning symptoms. 
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
## 2019-05-23 IMAGING — CR DG LUMBAR SPINE COMPLETE 4+V
5 series · 5 of 5 positions shown · non-contrast
Comparison: None.

CLINICAL DATA: Low back pain with weight-bearing.

EXAM:
LUMBAR SPINE - COMPLETE 4+ VIEW

[t lumbar spine ap]
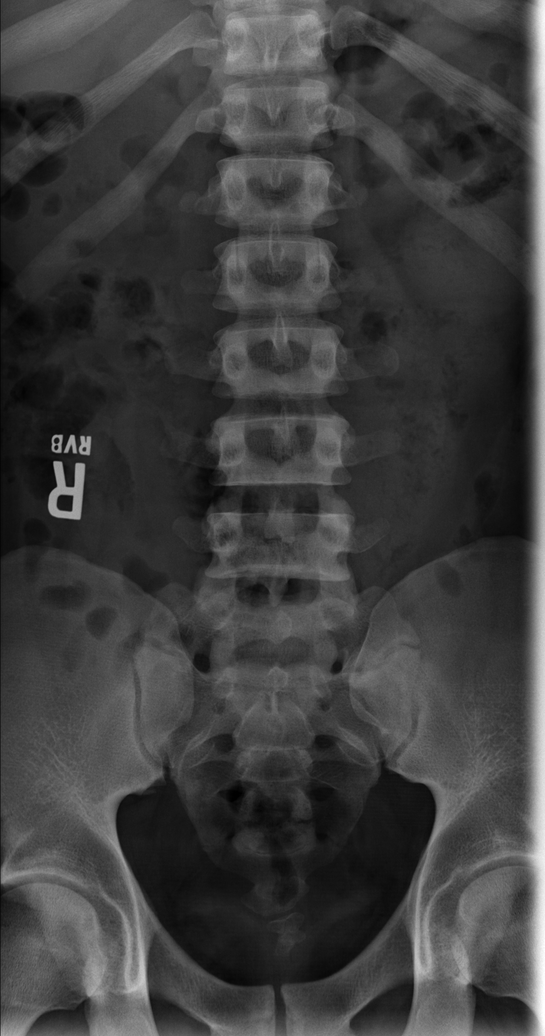

[t lumbar spine obl (1 of 2)]
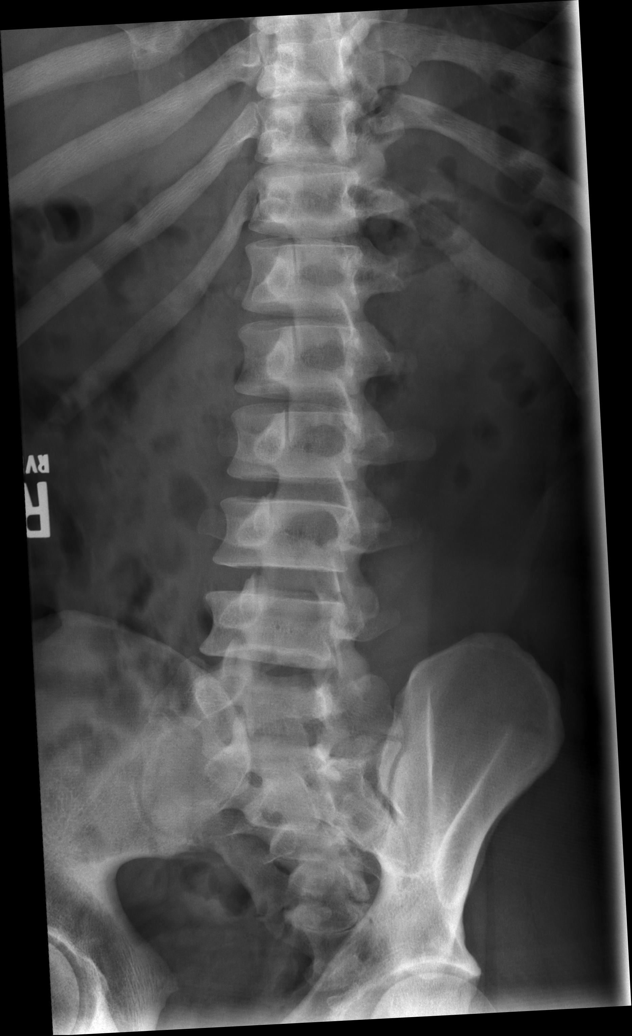

[t lumbar spine obl (2 of 2)]
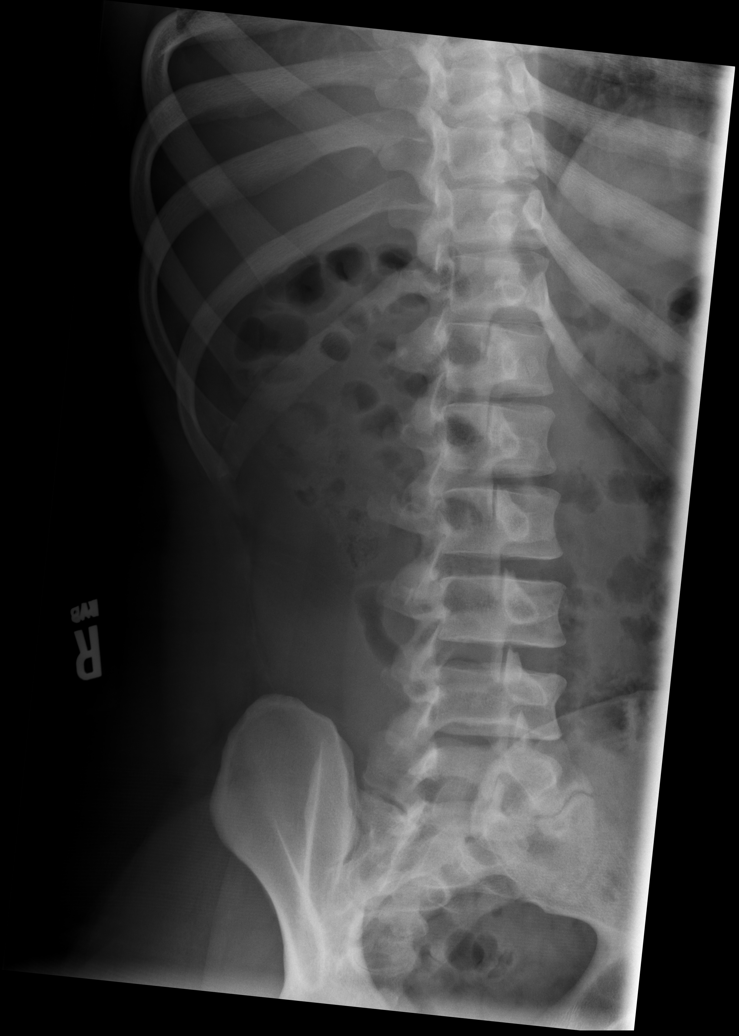

[t lumbar spine lat]
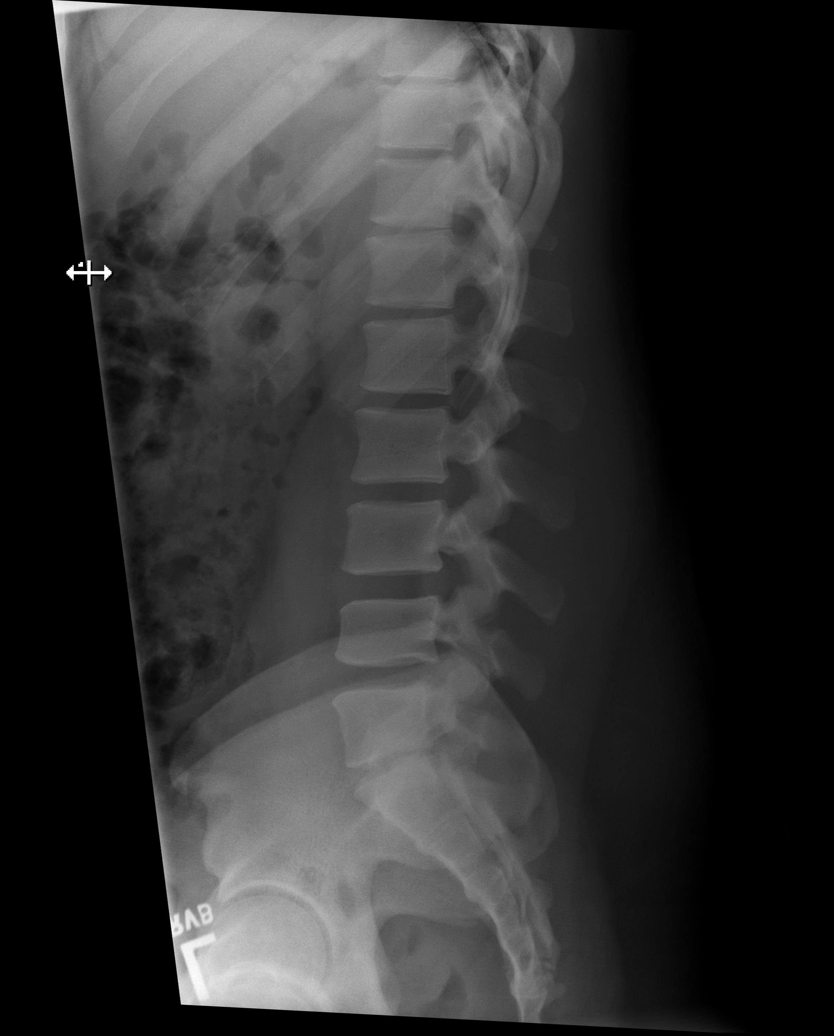

[t lumbar l-5 s-1 spot]
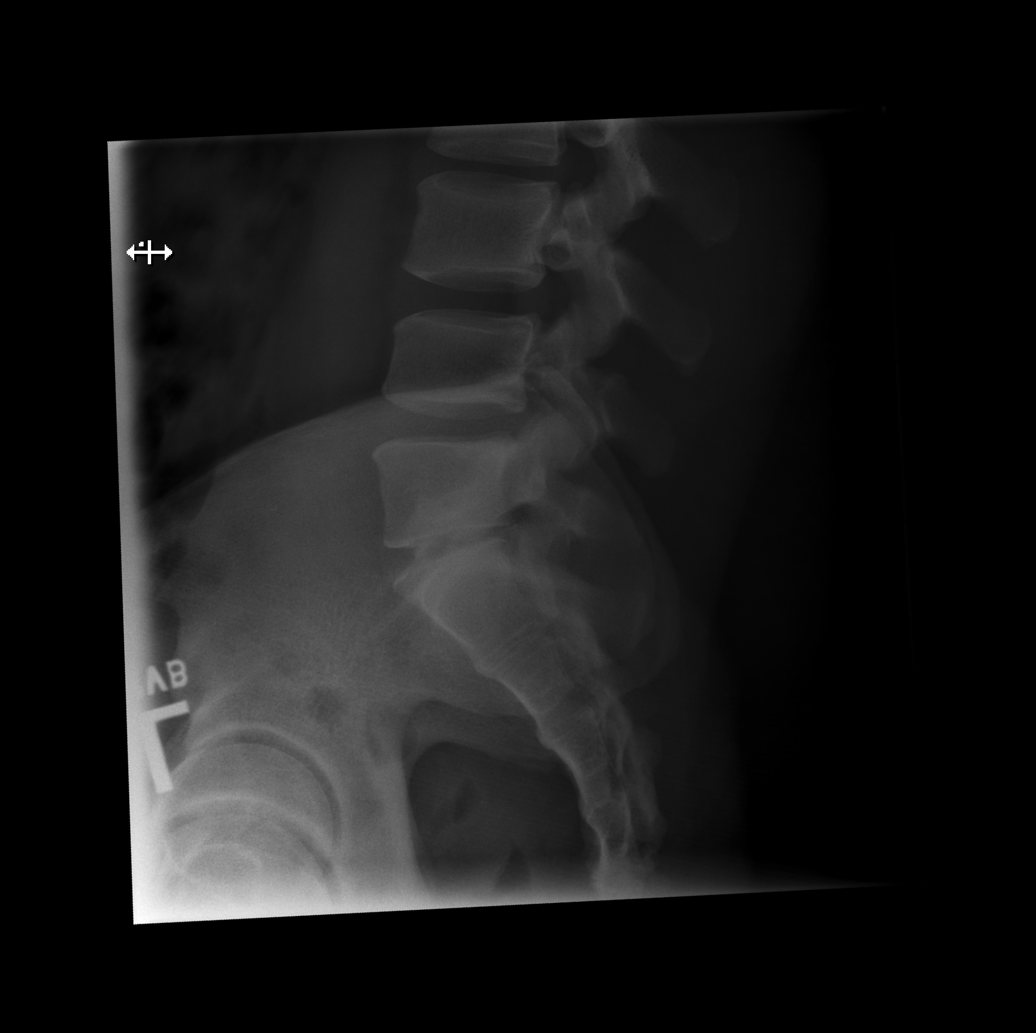

[5 of 5 positions shown; findings below may reference images not displayed]

FINDINGS: Transitional lumbosacral anatomy with lumbarization of S1.
Hypoplastic ribs at L1. The last well-formed disc space is labeled
S1-S2.

No acute fracture or subluxation. Vertebral body heights are
preserved. Trace retrolisthesis at L5-S1. Mild disc height loss at
S1-S2. Remaining intervertebral disc spaces are preserved. No pars
defects.
IMPRESSION: 1. Transitional lumbosacral anatomy as described above.
2. No acute osseous abnormality. Mild degenerative disc disease at
S1-S2.

## 2019-12-25 ENCOUNTER — Encounter (HOSPITAL_COMMUNITY): Payer: Self-pay | Admitting: Emergency Medicine

## 2019-12-25 ENCOUNTER — Other Ambulatory Visit: Payer: Self-pay

## 2019-12-25 ENCOUNTER — Emergency Department (HOSPITAL_COMMUNITY)
Admission: EM | Admit: 2019-12-25 | Discharge: 2019-12-25 | Disposition: A | Discharge status: Home / Self Care | Payer: Self-pay | Attending: Emergency Medicine | Admitting: Emergency Medicine

## 2019-12-25 DIAGNOSIS — W57XXXA Bitten or stung by nonvenomous insect and other nonvenomous arthropods, initial encounter: Secondary | ICD-10-CM | POA: Insufficient documentation

## 2019-12-25 DIAGNOSIS — S00561A Insect bite (nonvenomous) of lip, initial encounter: Secondary | ICD-10-CM | POA: Insufficient documentation

## 2019-12-25 DIAGNOSIS — Z87891 Personal history of nicotine dependence: Secondary | ICD-10-CM | POA: Insufficient documentation

## 2019-12-25 MED ORDER — NAPROXEN 500 MG PO TABS: 500.0000 mg | ORAL_TABLET | Freq: Two times a day (BID) | ORAL | 0 refills | Status: AC

## 2019-12-25 MED ORDER — EPINEPHRINE 0.3 MG/0.3ML IJ SOAJ: 0.3000 mg | INTRAMUSCULAR | 0 refills | Status: AC | PRN

## 2019-12-25 NOTE — Discharge Instructions (Addendum)
You may alternate taking Tylenol and Naproxen as needed for pain control. You may take Naproxen twice daily as directed on your discharge paperwork and you may take  [phone] mg of Tylenol every 6 hours. Do not exceed 4000 mg of Tylenol daily as this can lead to liver damage. Also, make sure to take Naproxen with meals as it can cause an upset stomach. Do not take other NSAIDs while taking Naproxen such as (Aleve, Ibuprofen, Aspirin, Celebrex, etc) and do not take more than the prescribed dose as this can lead to ulcers and bleeding in your GI tract. You may use warm and cold compresses to help with your symptoms.   I have written a prescription for an EpiPen as a precaution.  If the swelling on your face spreads to your lips, tongue, if you have difficulty swallowing, difficulty breathing, wheezing or other symptoms you should use the EpiPen and return to the emergency department immediately.  Please follow up with your primary doctor within the next 7-10 days for re-evaluation and further treatment of your symptoms.   Please return to the ER sooner if you have any new or worsening symptoms.
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
## 2019-12-25 NOTE — ED Provider Notes (Signed)
[facility] EMERGENCY DEPARTMENT Provider Note   CSN: [id number] Arrival date & time: 12/25/19  1242     History Chief Complaint  Patient presents with  . Bite to lip    Mark Johnston is a 29 y.o. male.  HPI   29 year old male presenting the left side of his face.  He experienced a localized swelling to this area.   He denies any significant lip swelling, tongue swelling, throat swelling, wheezing, shortness of breath, difficulty swallowing.  He has never had an anaphylactic reaction before.  He denies any rashes or hives.  History reviewed. No pertinent past medical history.  There are no problems to display for this patient.   History reviewed. No pertinent surgical history.     History reviewed. No pertinent family history.  Social History   Tobacco Use  . Smoking status: Former Smoker    Packs/day: 0.10    Types: Cigars    Quit date: 11/10/2016    Years since quitting: 3.1  . Smokeless tobacco: Never Used  Substance Use Topics  . Alcohol use: No  . Drug use: No    Home Medications Prior to Admission medications   Medication Sig Start Date End Date Taking? Authorizing Provider  albuterol (PROVENTIL HFA;VENTOLIN HFA) 108 (90 Base) MCG/ACT inhaler Inhale 1-2 puffs into the lungs every 6 (six) hours as needed for wheezing or shortness of breath. 03/10/15   Ward, Chase Picket, PA-C  amoxicillin (AMOXIL) 500 MG capsule Take 1 capsule (500 mg total) by mouth 3 (three) times daily. 04/22/15   Joni Reining, PA-C  cyclobenzaprine (FLEXERIL) 10 MG tablet Take 1 tablet (10 mg total) by mouth 2 (two) times daily as needed for muscle spasms. 06/01/16   McDonald, Mia A, PA-C  doxycycline (VIBRAMYCIN) 100 MG capsule Take 1 capsule (100 mg total) by mouth 2 (two) times daily. 05/13/16   Khatri, Hina, PA-C  EPINEPHrine 0.3 mg/0.3 mL IJ SOAJ injection Inject 0.3 mg into the muscle as needed for anaphylaxis. 12/25/19   Filip Luten S, PA-C  famotidine (PEPCID)  20 MG tablet Take 1 tablet (20 mg total) by mouth 2 (two) times daily. Patient not taking: Reported on 06/13/2014 10/14/13   Felicie Morn, NP  fexofenadine-pseudoephedrine (ALLEGRA-D) 60-120 MG 12 hr tablet Take 1 tablet by mouth 2 (two) times daily. 04/22/15   Joni Reining, PA-C  fluticasone (FLONASE) 50 MCG/ACT nasal spray Place 2 sprays into both nostrils daily. 05/13/16   Khatri, Hina, PA-C  hydrOXYzine (ATARAX/VISTARIL) 10 MG tablet Take 1 tablet (10 mg total) by mouth 3 (three) times daily as needed for itching. Patient not taking: Reported on 06/13/2014 10/14/13   Felicie Morn, NP  ibuprofen (ADVIL,MOTRIN) 600 MG tablet Take 1 tablet (600 mg total) by mouth every 8 (eight) hours as needed. 04/22/15   Joni Reining, PA-C  methocarbamol (ROBAXIN) 500 MG tablet Take 1 tablet (500 mg total) by mouth every 12 (twelve) hours as needed for muscle spasms. 05/20/19   Antony Madura, PA-C  naproxen (NAPROSYN) 500 MG tablet Take 1 tablet (500 mg total) by mouth 2 (two) times daily for 7 days. 12/25/19 01/01/20  Stephonie Wilcoxen S, PA-C  ondansetron (ZOFRAN) 4 MG tablet Take 1 tablet (4 mg total) by mouth every 6 (six) hours. Patient not taking: Reported on 09/11/2014 06/13/14   Harle Battiest, NP  predniSONE (DELTASONE) 10 MG tablet Take 2 tablets (20 mg total) by mouth daily. Patient not taking: Reported on 06/13/2014 10/14/13   Katrinka Blazing,  Onalee Hua, NP  sodium chloride (OCEAN) 0.65 % SOLN nasal spray Place 1 spray into both nostrils as needed for congestion. 09/11/14   Marisa Severin, MD    Allergies    Penicillins  Review of Systems   Review of Systems  HENT:       Facial swelling and pain. No lip swelling, tongue swelling or difficulty swallowing  Respiratory: Negative for cough and shortness of breath.   Cardiovascular: Negative for chest pain.  Skin: Negative for rash.    Physical Exam Updated Vital Signs BP (!) 118/105   Pulse 71   Temp 98.6 F (37 C) (Oral)   Resp 16   Ht 5\' 7"  (1.702 m)   Wt  97.5 kg   SpO2 98%   BMI 33.67 kg/m   Physical Exam Constitutional:      General: He is not in acute distress.    Appearance: He is well-developed.  HENT:     Mouth/Throat:     Comments: No angioedema.  No oropharyngeal edema.  Tolerating secretions.  Normal phonation.  No stridor.  Localized swelling to the left lower face just adjacent to the lip. Eyes:     Conjunctiva/sclera: Conjunctivae normal.  Cardiovascular:     Rate and Rhythm: Normal rate and regular rhythm.  Pulmonary:     Effort: Pulmonary effort is normal.     Breath sounds: Normal breath sounds. No wheezing.  Skin:    General: Skin is warm and dry.  Neurological:     Mental Status: He is alert and oriented to person, place, and time.     ED Results / Procedures / Treatments   Labs (all labs ordered are listed, but only abnormal results are displayed) Labs Reviewed - No data to display  EKG None  Radiology No results found.  Procedures Procedures (including critical care time)  Medications Ordered in ED Medications - No data to display  ED Course  I have reviewed the triage vital signs and the nursing notes.  Pertinent labs & imaging results that were available during my care of the patient were reviewed by me and considered in my medical decision making (see chart for details).    MDM Rules/Calculators/A&P                          Patient with localized swelling to the face after an insect bite prior to arrival.  This does not seem consistent with anaphylaxis, this likely represents an inflammatory reaction to being stung by something prior to arrival.  Advised anti-inflammatories ice packs at home.  Offered to monitor the patient and give him medications here to ensure that he does not have any progression of the swelling however he declines and would like to monitor at home.  I will give him an EpiPen as a precaution but advised that if his symptoms worsen he needs to return to the emergency  department immediately.  He voices understanding of the plan and reasons to return.  All questions answered.  Patient stable for discharge.  Final Clinical Impression(s) / ED Diagnoses Final diagnoses:  Insect bite, unspecified site, initial encounter    Rx / DC Orders ED Discharge Orders         Ordered    naproxen (NAPROSYN) 500 MG tablet  2 times daily        12/25/19 1340    EPINEPHrine 0.3 mg/0.3 mL IJ SOAJ injection  As needed  12/25/19 1340           Mida Cory S, PA-C 12/26/19 2157    Lorre Nick, MD 12/26/19 2318

## 2021-02-14 ENCOUNTER — Emergency Department (HOSPITAL_COMMUNITY)
Admission: EM | Admit: 2021-02-14 | Discharge: 2021-02-14 | Disposition: A | Discharge status: Home / Self Care | Payer: Self-pay | Attending: Emergency Medicine | Admitting: Emergency Medicine

## 2021-02-14 DIAGNOSIS — Z87891 Personal history of nicotine dependence: Secondary | ICD-10-CM | POA: Insufficient documentation

## 2021-02-14 DIAGNOSIS — Z28310 Unvaccinated for covid-19: Secondary | ICD-10-CM | POA: Insufficient documentation

## 2021-02-14 DIAGNOSIS — U071 COVID-19: Secondary | ICD-10-CM | POA: Insufficient documentation

## 2021-02-14 DIAGNOSIS — R109 Unspecified abdominal pain: Secondary | ICD-10-CM | POA: Insufficient documentation

## 2021-02-14 DIAGNOSIS — R000 Tachycardia, unspecified: Secondary | ICD-10-CM | POA: Insufficient documentation

## 2021-02-14 LAB — CBC WITH DIFFERENTIAL/PLATELET
Abs Immature Granulocytes: 0.03 10*3/uL (ref 0.00–0.07)
Basophils Absolute: 0 10*3/uL (ref 0.0–0.1)
Basophils Relative: 0 %
Eosinophils Absolute: 0 10*3/uL (ref 0.0–0.5)
Eosinophils Relative: 0 %
HCT: 41.8 % (ref 39.0–52.0)
Hemoglobin: 14.3 g/dL (ref 13.0–17.0)
Immature Granulocytes: 0 %
Lymphocytes Relative: 4 %
Lymphs Abs: 0.3 10*3/uL — ABNORMAL LOW (ref 0.7–4.0)
MCH: 29.1 pg (ref 26.0–34.0)
MCHC: 34.2 g/dL (ref 30.0–36.0)
MCV: 85.1 fL (ref 80.0–100.0)
Monocytes Absolute: 0.9 10*3/uL (ref 0.1–1.0)
Monocytes Relative: 11 %
Neutro Abs: 6.9 10*3/uL (ref 1.7–7.7)
Neutrophils Relative %: 85 %
Platelets: 319 10*3/uL (ref 150–400)
RBC: 4.91 MIL/uL (ref 4.22–5.81)
RDW: 12.1 % (ref 11.5–15.5)
WBC: 8.2 10*3/uL (ref 4.0–10.5)
nRBC: 0 % (ref 0.0–0.2)

## 2021-02-14 LAB — RESP PANEL BY RT-PCR (FLU A&B, COVID) ARPGX2
Influenza A by PCR: NEGATIVE
Influenza B by PCR: NEGATIVE
SARS Coronavirus 2 by RT PCR: POSITIVE — AB

## 2021-02-14 LAB — COMPREHENSIVE METABOLIC PANEL
ALT: 39 U/L (ref 0–44)
AST: 23 U/L (ref 15–41)
Albumin: 4.4 g/dL (ref 3.5–5.0)
Alkaline Phosphatase: 45 U/L (ref 38–126)
Anion gap: 10 (ref 5–15)
BUN: 6 mg/dL (ref 6–20)
CO2: 27 mmol/L (ref 22–32)
Calcium: 9.6 mg/dL (ref 8.9–10.3)
Chloride: 101 mmol/L (ref 98–111)
Creatinine, Ser: 1.16 mg/dL (ref 0.61–1.24)
GFR, Estimated: >60 mL/min (ref >60)
Glucose, Bld: 102 mg/dL — ABNORMAL HIGH (ref 70–99)
Potassium: 3.8 mmol/L (ref 3.5–5.1)
Sodium: 138 mmol/L (ref 135–145)
Total Bilirubin: 0.5 mg/dL (ref 0.3–1.2)
Total Protein: 7.2 g/dL (ref 6.5–8.1)

## 2021-02-14 LAB — LIPASE, BLOOD: Lipase: 26 U/L (ref 11–51)

## 2021-02-14 MED ORDER — ACETAMINOPHEN 325 MG PO TABS
650.0000 mg | ORAL_TABLET | Freq: Once | ORAL | Status: AC
  Administered 2021-02-14: 11:00:00 650 mg via ORAL
  Filled 2021-02-14: qty 2

## 2021-02-14 MED ORDER — ONDANSETRON HCL 8 MG PO TABS: 4.0000 mg | ORAL_TABLET | Freq: Four times a day (QID) | ORAL | 0 refills | Status: AC

## 2021-02-14 NOTE — ED Provider Notes (Signed)
Emergency Medicine Provider Triage Evaluation Note  Mark Johnston , a 30 y.o. male  was evaluated in triage.  Pt complains of flulike symptoms since last night.  Patient states that he woke up this morning with chills, body aches, fever.  Patient not vaccinated for COVID-19 or the flu.  Patient denies known sick contacts.  Review of Systems  Positive: Nausea, vomiting, chills, body aches, fever, sore throat, abdominal pain Negative: Chest pain, shortness of breath, diarrhea  Physical Exam  There were no vitals taken for this visit. Gen:   Awake, no distress   Resp:  Normal effort.  Lungs clear to auscultation bilaterally MSK:   Moves extremities without difficulty  Other:  Patient is diaphoretic  Medical Decision Making  Medically screening exam initiated at 10:26 AM.  Appropriate orders placed.  [name] was informed that the remainder of the evaluation will be completed by another provider, this initial triage assessment does not replace that evaluation, and the importance of remaining in the ED until their evaluation is complete.     Al Decant, PA-C 02/14/21 1027    Rozelle Logan, DO 02/14/21 1146

## 2021-02-14 NOTE — ED Provider Notes (Signed)
[facility] EMERGENCY DEPARTMENT Provider Note   CSN: [id number] Arrival date & time: 02/14/21  1016     History Chief Complaint  Patient presents with   Generalized Body Aches   Nausea   Emesis    Mark Johnston is a 30 y.o. male with no previous medical history per chart review.  Patient presents to ED due to cough, chills, body aches, abdominal pain, sore throat and fever since this morning when he woke up.  Patient denies being vaccinated against COVID-19 or the flu at this time.  Patient denies known sick contacts.  Patient denies chest pain, shortness of breath, diarrhea.   Emesis Associated symptoms: abdominal pain, chills, fever, myalgias and sore throat   Associated symptoms: no diarrhea       No past medical history on file.  There are no problems to display for this patient.   No past surgical history on file.     No family history on file.  Social History   Tobacco Use   Smoking status: Former    Packs/day: 0.10    Types: Cigars, Cigarettes    Quit date: 11/10/2016    Years since quitting: 4.2   Smokeless tobacco: Never  Substance Use Topics   Alcohol use: No   Drug use: No    Home Medications Prior to Admission medications   Medication Sig Start Date End Date Taking? Authorizing Provider  EPINEPHrine 0.3 mg/0.3 mL IJ SOAJ injection Inject 0.3 mg into the muscle as needed for anaphylaxis. 12/25/19  Yes Couture, Cortni S, PA-C  ondansetron (ZOFRAN) 8 MG tablet Take 0.5 tablets (4 mg total) by mouth every 6 (six) hours. 02/14/21  Yes Al Decant, PA-C    Allergies    Bee venom and Penicillins  Review of Systems   Review of Systems  Constitutional:  Positive for chills and fever.  HENT:  Positive for congestion and sore throat. Negative for trouble swallowing.   Respiratory:  Negative for shortness of breath.   Cardiovascular:  Negative for chest pain.  Gastrointestinal:  Positive for abdominal pain, nausea and  vomiting. Negative for diarrhea.  Musculoskeletal:  Positive for back pain and myalgias.  All other systems reviewed and are negative.  Physical Exam Updated Vital Signs BP 124/74    Pulse (!) 101    Temp 99.8 F (37.7 C) (Oral)    Resp 18    SpO2 98%   Physical Exam Vitals and nursing note reviewed.  Constitutional:      General: He is not in acute distress.    Appearance: He is diaphoretic. He is not toxic-appearing.  HENT:     Head: Normocephalic.     Nose: Nose normal.     Mouth/Throat:     Mouth: Mucous membranes are moist.     Pharynx: Posterior oropharyngeal erythema present.  Eyes:     Pupils: Pupils are equal, round, and reactive to light.  Cardiovascular:     Rate and Rhythm: Regular rhythm. Tachycardia present.     Pulses: Normal pulses.  Pulmonary:     Effort: Pulmonary effort is normal.     Breath sounds: Normal breath sounds. No stridor. No wheezing, rhonchi or rales.  Abdominal:     General: Abdomen is flat.     Palpations: Abdomen is soft.     Tenderness: There is no abdominal tenderness.  Musculoskeletal:     Cervical back: Normal range of motion and neck supple. No rigidity.  Skin:  General: Skin is warm.     Capillary Refill: Capillary refill takes less than 2 seconds.  Neurological:     Mental Status: He is alert. Mental status is at baseline.    ED Results / Procedures / Treatments   Labs (all labs ordered are listed, but only abnormal results are displayed) Labs Reviewed  RESP PANEL BY RT-PCR (FLU A&B, COVID) ARPGX2 - Abnormal; Notable for the following components:      Result Value   SARS Coronavirus 2 by RT PCR POSITIVE (*)    All other components within normal limits  CBC WITH DIFFERENTIAL/PLATELET - Abnormal; Notable for the following components:   Lymphs Abs 0.3 (*)    All other components within normal limits  COMPREHENSIVE METABOLIC PANEL - Abnormal; Notable for the following components:   Glucose, Bld 102 (*)    All other  components within normal limits  LIPASE, BLOOD    EKG None  Radiology No results found.  Procedures Procedures   Medications Ordered in ED Medications  acetaminophen (TYLENOL) tablet 650 mg (650 mg Oral Given 02/14/21 1038)    ED Course  I have reviewed the triage vital signs and the nursing notes.  Pertinent labs & imaging results that were available during my care of the patient were reviewed by me and considered in my medical decision making (see chart for details).    MDM Rules/Calculators/A&P                          30 year old male presents with flulike symptoms since this morning.  Patient not vaccinated.  On examination, patient is diaphoretic, afebrile at 99.8 Fahrenheit, slightly tachycardic at a pulse rate of 101 with clear lung sounds bilaterally on auscultation.  No wheezes, rhonchi, stridor noted.  Patient throat erythematous with midline uvula.  Patient appropriately handling secretions, no trismus.  Patient had respiratory panel conducted which showed a positive COVID result.  I have discussed with this patient his return precautions to include shortness of breath and he voices understanding with these.  I have advised him that he will be contagious for the next couple of days and I provided him with CDC guidelines on isolating himself and quarantine.  I have sent him a prescription of Zofran for his nausea.  I have instructed him to treat his fevers with Tylenol and his body aches ibuprofen.  I have advised him he needs to wear a mask at all times when in public and only going to public if necessary.  This patient is stable for discharge and is stable at the time of discharge.  Final Clinical Impression(s) / ED Diagnoses Final diagnoses:  COVID    Rx / DC Orders ED Discharge Orders          Ordered    ondansetron (ZOFRAN) 8 MG tablet  Every 6 hours        02/14/21 1225             Al Decant, PA-C 02/14/21 1238    Horton, Clabe Seal,  DO 02/15/21 1530

## 2021-02-14 NOTE — ED Triage Notes (Signed)
Pt. Stated, I woke up with body aches with sweats and feeling bad all over this morning.

## 2021-02-14 NOTE — Discharge Instructions (Addendum)
Return to ED with any new or worsening symptoms such as shortness of breath You need to isolate yourself per CDC protocol.  I have attached instructions to this discharge packet Please pick up the antinausea medication I prescribed to your pharmacy You can treat your body aches with ibuprofen and or fevers with Tylenol If you must go to public, wear a mask.  Preferably N95.

## 2021-02-14 NOTE — ED Notes (Signed)
ED Provider at bedside. 

## 2021-06-25 ENCOUNTER — Inpatient Hospital Stay
Admit: 2021-06-25 | Discharge: 2021-06-25 | Disposition: A | Discharge status: Home / Self Care | Payer: BLUE CROSS/BLUE SHIELD | Attending: Emergency Medicine

## 2021-06-25 ENCOUNTER — Emergency Department: Admit: 2021-06-25 | Payer: BLUE CROSS/BLUE SHIELD

## 2021-06-25 DIAGNOSIS — S29019A Strain of muscle and tendon of unspecified wall of thorax, initial encounter: Secondary | ICD-10-CM

## 2021-06-25 DIAGNOSIS — S29012A Strain of muscle and tendon of back wall of thorax, initial encounter: Secondary | ICD-10-CM

## 2021-06-25 MED ORDER — CYCLOBENZAPRINE HCL 10 MG PO TABS
10 MG | ORAL_TABLET | Freq: Three times a day (TID) | ORAL | 0 refills | Status: AC | PRN
Start: 2021-06-25 — End: 2021-07-05

## 2021-06-25 MED ORDER — CYCLOBENZAPRINE HCL 10 MG PO TABS
10 MG | ORAL | Status: AC
Start: 2021-06-25 — End: 2021-06-25
  Administered 2021-06-25: 13:00:00 10 mg via ORAL

## 2021-06-25 MED ORDER — NAPROXEN 500 MG PO TABS
500 MG | ORAL_TABLET | Freq: Two times a day (BID) | ORAL | 0 refills | Status: DC | PRN
Start: 2021-06-25 — End: 2021-11-25

## 2021-06-25 MED ORDER — NAPROXEN 250 MG PO TABS
250 MG | ORAL | Status: AC
Start: 2021-06-25 — End: 2021-06-25
  Administered 2021-06-25: 13:00:00 500 mg via ORAL

## 2021-06-25 MED FILL — NAPROXEN 250 MG PO TABS: 250 MG | ORAL | Qty: 2

## 2021-06-25 MED FILL — CYCLOBENZAPRINE HCL 10 MG PO TABS: 10 MG | ORAL | Qty: 1

## 2021-06-25 NOTE — ED Notes (Signed)
Pt back from XRAY.   Pt states he is still in pain but doing better than he was when he came in.     Marylou Mccoy, RN  06/25/21 [phone]
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
## 2021-06-25 NOTE — ED Notes (Signed)
10:10 AM  06/25/21     Discharge instructions given to  Benjamin Cannon with verbalization of understanding. Patient accompanied by mother.  Patient discharged with the following prescriptions naproxen and cyclobenzaprine. Patient discharged to home.      Marylou Mccoy, RN       Marylou Mccoy, RN  06/25/21 1011

## 2021-06-25 NOTE — ED Notes (Signed)
At pt bedside to introduce myself.  Pt is A&O x 4   Pt is calm and laying in bed and expresses pain in lower back, using left hand to "put pressure on the spot to help the pain."    Call bell within in reach     Marylou Mccoy, RN  06/25/21 [phone]

## 2021-08-06 ENCOUNTER — Inpatient Hospital Stay: Admit: 2021-08-06 | Payer: BLUE CROSS/BLUE SHIELD

## 2021-08-06 DIAGNOSIS — M5442 Lumbago with sciatica, left side: Secondary | ICD-10-CM
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
## 2021-08-08 NOTE — Progress Notes (Signed)
Patient has cancelled today's appointment due to illness.

## 2021-08-09 ENCOUNTER — Inpatient Hospital Stay: Payer: BLUE CROSS/BLUE SHIELD

## 2021-08-12 ENCOUNTER — Inpatient Hospital Stay: Admit: 2021-08-12 | Payer: BLUE CROSS/BLUE SHIELD

## 2021-08-12 NOTE — Progress Notes (Signed)
Physical Therapy Daily Treatment       Patient: Carnella Guadalajara II  Patient DOB verified :Yes  Payor: BCBS    Visit #: 2 of 8  Total Treatment Time: 45 minutes    Treatment Area: Back    Subjective:     Subjective functional status/changes: Complained of bilateral anterior hip and lower back pain    Pain Level (0-10 scale) current: 5/10    Objective:     Treatment Program:Therapeutic Exercise: As per Flow Sheet and Teaching of a Home Exercise Program   Exercise Flow sheet   08/12/2021   Exercise Reps/Wts   LTR 10   SKTC 10   Supine HS Glides 5   Supine hip Rotation 15-3 lbs   Supine Hip flexion 15-3 lbs   Piriformis Stretch 5   Iso Hip Add 15   DKTC with Ball 15   SLR off Ball ND   Bridges ND   Seated trunk flexion with ball 3x10   Seated HS curls ND   Pallof 10-maroon   Standing HIp ext/abd 15-green   Calf Stretch 5   Sci-Fit 10 min      Rationale for the above treatment:Decrease Pain and Improve ROM/Flexibility    Patient Education:    Barriers to Learning/Limitations: None   Education/Teaching provided to patient on HEP and instruction of correct body mechanics when lifting   Patient / Family readiness to learn indicated by: Demonstrates and Verbalizes      Assessment:     Good effort with exercises. Still with end range trunk soreness     Plan:     Skilled Physical Therapy services of supervised Therapeutic Exercise and  Pain-Relieving Modalities to modify and address therapeutic interventions, address ROM deficits, analyze and address soft tissue restrictions, and instruction in home exercise program  to meet stated goals.

## 2021-08-14 NOTE — Progress Notes (Signed)
Patient did not show or call to cancel today's appointment.

## 2021-08-15 ENCOUNTER — Inpatient Hospital Stay
Admit: 2021-08-15 | Discharge: 2021-08-15 | Disposition: A | Discharge status: Home / Self Care | Payer: BLUE CROSS/BLUE SHIELD | Attending: Emergency Medicine

## 2021-08-15 ENCOUNTER — Inpatient Hospital Stay: Payer: BLUE CROSS/BLUE SHIELD

## 2021-08-15 DIAGNOSIS — H6692 Otitis media, unspecified, left ear: Secondary | ICD-10-CM

## 2021-08-15 MED ORDER — CEFDINIR 300 MG PO CAPS
300 MG | ORAL_CAPSULE | Freq: Two times a day (BID) | ORAL | 0 refills | Status: AC
Start: 2021-08-15 — End: 2021-08-22

## 2021-08-15 MED ORDER — IBUPROFEN 600 MG PO TABS
600 MG | ORAL | Status: DC
Start: 2021-08-15 — End: 2021-08-15

## 2021-08-15 MED ORDER — CEFDINIR 300 MG PO CAPS
300 MG | ORAL | Status: DC
Start: 2021-08-15 — End: 2021-08-15

## 2021-08-15 NOTE — ED Provider Notes (Signed)
[facility] Care  Emergency Department Treatment Report        Patient: Benjamin Cannon Age: 31 y.o. Sex: male    Date of Birth: Aug 09, 1990 Admit Date: 08/15/2021 PCP: No primary care provider on file.   MRN: [id number]  CSN: [id number]  Attending: Fonda Kinder, DO   Room: T02/T02 Time Dictated: 2:43 AM APP:  Dan Humphreys, PA-C       Chief Complaint   Chief Complaint   Patient presents with    Otalgia       History of Present Illness   This is a 31 y.o. male presents complaining of left earache for the past 4 days.  Denies drainage from the ear, decreased hearing.  Denies fever, chills, runny nose, nasal congestion.  He was seen by telehealth yesterday and prescribed prednisone and advised to take nasal spray with minimal improvement of his symptoms.    Review of Systems   Constitutional: No fever, chills  Eyes: No eye redness, drainage   ENT: positive right otalgia; No sore throat, runny nose  Respiratory: No cough, dyspnea or wheezing.  Cardiovascular: No chest pain, palpitations,   Gastrointestinal: No vomiting  Integumentary: No rashes.  Neurological: No headaches  Denies complaints in all other systems.    Past Medical/Surgical History   History reviewed. No pertinent past medical history.  History reviewed. No pertinent surgical history.    Social History     Social History     Socioeconomic History    Marital status: Single     Spouse name: Not on file    Number of children: Not on file    Years of education: Not on file    Highest education level: Not on file   Occupational History    Not on file   Tobacco Use    Smoking status: Never    Smokeless tobacco: Never   Vaping Use    Vaping Use: Not on file   Substance and Sexual Activity    Alcohol use: Yes    Drug use: Yes     Frequency: 1.0 times per week     Types: Marijuana Sheran Fava)    Sexual activity: Not on file   Other Topics Concern    Not on file   Social History Narrative    Not on file     Social Determinants of Health     Financial  Resource Strain: Not on file   Food Insecurity: Not on file   Transportation Needs: Not on file   Physical Activity: Not on file   Stress: Not on file   Social Connections: Not on file   Intimate Partner Violence: Not on file   Housing Stability: Not on file       Family History   History reviewed. No pertinent family history.    Current Medications     Current Facility-Administered Medications   Medication Dose Route Frequency Provider Last Rate Last Admin    cefdinir (OMNICEF) capsule 300 mg  300 mg Oral NOW Dan Humphreys, PA-C        ibuprofen (ADVIL;MOTRIN) tablet 600 mg  600 mg Oral NOW Dan Humphreys, PA-C         Current Outpatient Medications   Medication Sig Dispense Refill    cefdinir (OMNICEF) 300 MG capsule Take 1 capsule by mouth 2 times daily for 7 days 14 capsule 0    naproxen (NAPROSYN) 500 MG tablet Take 1 tablet by mouth 2  times daily as needed for Pain 10 tablet 0       Allergies   No Known Allergies    Physical Exam   Patient Vitals for the past 24 hrs:   BP Temp Temp src Pulse Resp SpO2 Height Weight   08/15/21 0203 (!) 158/97 98.8 F (37.1 C) Oral 58 15 94 % 1.727 m 97.5 kg       Constitutional: Patient appears well developed and well nourished. Appearance and behavior are age and situation appropriate.  HEENT: Conjunctiva clear. PERRLA. Mucous membranes moist, non-erythematous. Surface of the pharynx, palate, and tongue are pink, moist and without lesions.  External and internal exam of the right ear is unremarkable.  External exam of the left ear reveals mild tenderness over the tragus, no tenderness over the auricle and internal examination reveals diffuse erythema to the distal auditory canal and covering the tympanic membrane which is bulging with exudative fluid behind the TM  Neck: supple, non tender, symmetrical, no masses or JVD. No lymphadenopathy.  Integumentary: warm and dry without rashes or lesions  Neurologic: alert and oriented. No facial asymmetry or dysarthria. Moving all  extremities well  Impression and Management Plan   31 y.o. male presents complaining of left otalgia.  Patient is afebrile and overall well-appearing, nontoxic.  His vital signs are stable.  On my exam he has the appearance of acute left otitis media; rest of his physical exam is benign  Differential diagnoses include otitis media, otitis externa, serous otitis media, foreign body, perforated TM    Diagnostic Studies   Lab:   No results found for any visits on 08/15/21.    ED Course         RECORDS REVIEWED:  I reviewed the patient's previous records here at [facility] and available outside facilities and note that no recent ED records pertinent to this visit    EXTERNAL RESULTS REVIEWED: N/A    INDEPENDENT HISTORIAN:  History and/or plan development assisted by: Self    Severe exacerbation or progression of chronic illness: No, acute problem      SOCIAL DETERMINANTS  impacting Evaluation and Management: Provider accessibility      Comorbidities impacting Evaluation and Management: None      Critical Care Time (if necessary)     Medications   cefdinir (OMNICEF) capsule 300 mg (has no administration in time range)   ibuprofen (ADVIL;MOTRIN) tablet 600 mg (has no administration in time range)       NARRATIVE:  Patient remained stable throughout his stay in the ED with no new or worsening symptoms.  Patient with acute left otitis media.  We will start on antibiotics-he request first dose here.  We will medicate with ibuprofen for pain.  I feel that he stable for discharge home and outpatient management.    Medical Decision Making   Patient advised to follow-up with his primary care physician.  He may take Tylenol and ibuprofen as directed as needed for pain.  We will discharge home with 7-day course of Omnicef given his penicillin allergy.  Patient advised to return to the ED for new or worsening symptoms.    Final Diagnosis       ICD-10-CM    1. Acute left otitis media  H66.92           Disposition   Patient stable for  discharge home.       Medication List        START taking these medications  cefdinir 300 MG capsule  Commonly known as: OMNICEF  Take 1 capsule by mouth 2 times daily for 7 days            ASK your doctor about these medications      naproxen 500 MG tablet  Commonly known as: NAPROSYN  Take 1 tablet by mouth 2 times daily as needed for Pain               Where to Get Your Medications        These medications were sent to [facility] [address] [place] - [address] - P [phone] Carmon Ginsberg [phone]  [address], [place] [address]      Phone: [phone]   cefdinir 300 MG capsule       The patient was fully discussed with Fonda Kinder, DO who agrees with the above assessment and plan      Luna Fuse PA-C  August 15, 2021    My signature above authenticates this document and my orders, the final    diagnosis (es), discharge prescription (s), and instructions in the Epic    record.  If you have any questions please contact [phone].     Nursing notes have been reviewed by the physician/ advanced practice    Clinician.                           Dan Humphreys, PA-C  08/15/21 [phone]

## 2021-08-15 NOTE — Discharge Instructions (Signed)
Tylenol and ibuprofen as directed as needed for pain  Follow-up with your primary care physician  Return to the ED for new or worsening symptoms

## 2021-08-15 NOTE — ED Triage Notes (Signed)
Pt arrives to triage c/o left ear pain x 4 days. Pt reports is on prednisone prescribed by virtual apt and pain not improved

## 2021-08-19 NOTE — Progress Notes (Signed)
Patient did not show or call to cancel today's appointment.

## 2021-08-20 ENCOUNTER — Inpatient Hospital Stay: Payer: BLUE CROSS/BLUE SHIELD

## 2021-08-21 NOTE — Progress Notes (Signed)
Patient did not show or call to cancel today's appointment.

## 2021-08-22 ENCOUNTER — Inpatient Hospital Stay: Payer: BLUE CROSS/BLUE SHIELD

## 2021-08-26 ENCOUNTER — Inpatient Hospital Stay: Admit: 2021-08-26 | Payer: BLUE CROSS/BLUE SHIELD

## 2021-08-26 DIAGNOSIS — M5442 Lumbago with sciatica, left side: Secondary | ICD-10-CM

## 2021-08-26 NOTE — Progress Notes (Signed)
Physical Therapy Daily Treatment       Patient: Benjamin Cannon  Patient DOB verified :Yes  Payor: BCBS    Visit #: 3 of 8  Total Treatment Time: 45 minutes    Treatment Area: Back    Subjective:     Subjective functional status/changes: No specific complaints. Returned to work    Pain Level (0-10 scale) current: NR/10    Objective:     Treatment Program:Therapeutic Exercise: As per Flow Sheet and Teaching of a Home Exercise Program   Exercise Flow sheet   08/26/2021   Exercise Reps/Wts   LTR 10   SKTC 10   Supine HS Glides 5   Supine hip Rotation 15-4 lbs   Supine Hip flexion 15-4 lbs   Piriformis Stretch 5   Iso Hip Add 15   DKTC with Ball 15   Bidex rows 3x10-6 plates   Biodex Lat pulldown 10-6 plates   Bridges 10   Seated trunk flexion with ball 3x10   Seated HS curls ND   Pallof 10-maroon   Standing HIp ext/abd 15-green   Calf Stretch 5   Sci-Fit 10 min      Rationale for the above treatment:Decrease Pain and Improve ROM/Flexibility    Patient Education:    Barriers to Learning/Limitations: None   Education/Teaching provided to patient on HEP and instruction of correct body mechanics when lifting   Patient / Family readiness to learn indicated by: Demonstrates and Verbalizes      Assessment:     No pain reported with exercises. Full trunk ROM.    Plan:     Skilled Physical Therapy services of supervised Therapeutic Exercise and  Pain-Relieving Modalities to modify and address therapeutic interventions, address ROM deficits, analyze and address soft tissue restrictions, and instruction in home exercise program  to meet stated goals.

## 2021-09-16 NOTE — Progress Notes (Signed)
Patient did not show or call to cancel today's appointment.

## 2021-09-17 ENCOUNTER — Inpatient Hospital Stay: Payer: BLUE CROSS/BLUE SHIELD

## 2021-09-30 ENCOUNTER — Inpatient Hospital Stay
Admit: 2021-09-30 | Discharge: 2021-10-01 | Disposition: A | Discharge status: Home / Self Care | Attending: Emergency Medicine

## 2021-09-30 DIAGNOSIS — R310 Gross hematuria: Secondary | ICD-10-CM

## 2021-09-30 LAB — MICROSCOPIC URINALYSIS

## 2021-09-30 LAB — POCT URINALYSIS DIPSTICK
Bilirubin, Urine: NEGATIVE
Glucose, Ur: NEGATIVE mg/dl
Ketones, Urine: NEGATIVE mg/dl
Leukocyte Esterase, Urine: NEGATIVE
Nitrite, Urine: NEGATIVE
Protein, Urine: 30 mg/dl — AB
Specific Gravity, Urine: 1.02 (ref 1.005–1.030)
Urobilinogen, Urine: 0.2 EU/dl (ref 0.0–1.0)
pH, Urine: 5.5 (ref 5–9)

## 2021-09-30 LAB — URINALYSIS
Bilirubin, Urine: NEGATIVE
Glucose, Ur: NEGATIVE mg/dl
Ketones, Urine: NEGATIVE mg/dl
Leukocyte Esterase, Urine: NEGATIVE
Nitrite, Urine: NEGATIVE
Protein, Urine: 30 mg/dl — AB
Specific Gravity, Urine: 1.02 (ref 1.005–1.030)
Urobilinogen, Urine: 0.2 mg/dl (ref 0.0–1.0)
pH, Urine: 5.5 (ref 5.0–9.0)

## 2021-09-30 LAB — POC CHEM 8
BUN: 7 mg/dl (ref 7–25)
Calcium, Ionized: 4.8 mg/dL (ref 4.40–5.40)
Chloride: 101 mEq/L (ref 98–107)
Creatinine: 1.1 mg/dl (ref 0.6–1.3)
Glucose: 93 mg/dL (ref 74–106)
Hematocrit: 50 % (ref 40–54)
Hemoglobin: 17 gm/dl (ref 13.0–17.2)
Potassium: 4.2 mEq/L (ref 3.5–4.9)
Sodium: 139 mEq/L (ref 136–145)
Total CO2: 27 mmol/L (ref 21–32)

## 2021-09-30 MED ORDER — SODIUM CHLORIDE 0.9 % IV BOLUS
0.9 % | Freq: Once | INTRAVENOUS | Status: AC
Start: 2021-09-30 — End: 2021-09-30
  Administered 2021-09-30: 1000 mL via INTRAVENOUS

## 2021-09-30 NOTE — Discharge Instructions (Signed)
Follow up with your family doctor or provider given.  Return or return with new or worsening symptoms, or any concerns.  Follow up with urology of [place].     Results for orders placed or performed during the hospital encounter of 09/30/21   Urinalysis   Result Value Ref Range    Color, UA Red (A) YELLOW,STRAW      Clarity, UA Cloudy (A) CLEAR      Glucose, Ur Negative NEGATIVE,Negative mg/dl    Bilirubin, Urine Negative NEGATIVE,Negative      Ketones, Urine Negative NEGATIVE,Negative mg/dl    Specific Gravity, Urine 1.020 1.005 - 1.030      Blood, Urine Large (A) NEGATIVE,Negative      pH, Urine 5.5 5.0 - 9.0      Protein, Urine 30 mg/dL (A) NEGATIVE,Negative mg/dl    Urobilinogen, Urine 0.2 E.U./dL 0.0 - 1.0 mg/dl    Nitrite, Urine Negative NEGATIVE,Negative      Leukocyte Esterase, Urine Negative NEGATIVE,Negative     Microscopic Urinalysis   Result Value Ref Range    WBC, UA 5-9 /HPF    RBC, UA TNTC /HPF   POCT Urinalysis no Micro   Result Value Ref Range    Glucose, Ur Negative NEGATIVE,Negative mg/dl    Bilirubin, Urine Negative NEGATIVE,Negative      Ketones, Urine Negative NEGATIVE,Negative mg/dl    Specific Gravity, Urine 1.020 1.005 - 1.030      Blood, Urine Large (A) NEGATIVE,Negative      pH, Urine 5.5 5 - 9      Protein, Urine 30 (A) NEGATIVE,Negative mg/dl    Urobilinogen, Urine 0.2 0.0 - 1.0 EU/dl    Nitrite, Urine Negative NEGATIVE,Negative      Leukocyte Esterase, Urine Negative NEGATIVE,Negative      Color, UA Red      Clarity, UA Slightly Cloudy     POC CHEM 8   Result Value Ref Range    Sodium 139 136 - 145 mEq/L    Potassium 4.2 3.5 - 4.9 mEq/L    Chloride 101 98 - 107 mEq/L    Total CO2 27 21 - 32 mmol/L    Glucose 93 74 - 106 mg/dL    BUN 7 7 - 25 mg/dl    Creatinine 1.1 0.6 - 1.3 mg/dl    Hematocrit 50 40 - 54 %    Hemoglobin 17.0 13.0 - 17.2 gm/dl    Calcium, Ionized 4.01 4.40 - 5.40 mg/dL

## 2021-09-30 NOTE — ED Provider Notes (Signed)
[facility] Care  Emergency Department Treatment Report    Patient: Benjamin Cannon Age: 31 y.o. Sex: male    Date of Birth: Dec 03, 1990 Admit Date: 09/30/2021 PCP: None None   MRN: [id number]  CSN: [id number]  PRETKO, RACHEL L   Room: H10/H10 Time Dictated: 10:31 PM Norvel Wenker     Chief Complaint     Chief Complaint   Patient presents with    Hematuria       History of Present Illness     This is a 31 y.o. male with gross hematuria.  Patient states that 9 ejaculation he felt like he had gotten "stopped up" during intercourse, and the next day had noted some pain with urination and occasionally streaks of blood or gross hematuria.  Patient denies any discharge, denies any other symptoms except for occasional radiating pain to the suprapubic region.  Presents for further evaluation to the emergency department.    Review of Systems     Constitutional: No fever, chills, or weight loss  Eyes: No visual symptoms.  ENT: No sore throat, runny nose or ear pain.  Respiratory: No cough, dyspnea or wheezing.  Cardiovascular: No chest pain, pressure, palpitations, tightness or heaviness.  Gastrointestinal: No vomiting, diarrhea or abdominal pain.  Genitourinary: hematuria, occasional dysuria, no change in frequency or urgency.    Past Medical/Surgical History     No chronic illness    Social History     Social History     Socioeconomic History    Marital status: Single   Tobacco Use    Smoking status: Never    Smokeless tobacco: Never   Substance and Sexual Activity    Alcohol use: Yes    Drug use: Yes     Frequency: 1.0 times per week     Types: Marijuana (Weed)         Family History     No family history on file.      Current Medications     No current facility-administered medications for this encounter.     Current Outpatient Medications   Medication Sig Dispense Refill    naproxen (NAPROSYN) 500 MG tablet Take 1 tablet by mouth 2 times daily as needed for Pain 10 tablet 0       Allergies     No Known  Allergies    Physical Exam     BP 124/71   Pulse 52   Temp 98.2 F (36.8 C)   Resp 16   Ht 1.727 m   Wt 99.8 kg   SpO2 98%   BMI 33.45 kg/m     Constitutional: Patient appears well developed and well nourished. Appearance and behavior are age and situation appropriate.  Eyes: Conjutivae clear, lids normal. Pupils equal, symmetrical, and normally reactive.  HEENT: Ears/Nose: Hearing is grossly intact to voice. Internal and external examinations of the ears and nose are unremarkable.  PERRLA. Mucous membranes moist, non-erythematous. Surface of the pharynx, palate, and tongue are pink, moist and without lesions.  Respiratory: lungs clear to auscultation, nonlabored respirations. No tachypnea or accessory muscle use.  Cardiovascular: heart regular rate and rhythm without murmur rubs or gallops.   Gastrointestinal:  Abdomen soft, nontender without complaint of pain to palpation  Genitalia: No lesions, inflammation or discharge from penis. Scrotum testes distended; symmetric, no masses.    Impression and Management Plan     [place] for AKI, UTI, gross hematuria gonorrhea chlamydia, this may be simply a traumatic urethritis.  Will  wait for STI as well.    Differential Diagnoses: as above.    Procedures       Diagnostic Studies       Lab:     Results for orders placed or performed during the hospital encounter of 09/30/21   C.trachomatis N.gonorrhoeae DNA    Specimen: Urine   Result Value Ref Range    Chlamydia Trachomatis DNA, SDA NOT DETECTED NOT DETECTED,NOT DETECTED      NEISSERIA GONORRHOEAE, DNA NOT DETECTED NOT DETECTED,NOT DETECTED     Urinalysis   Result Value Ref Range    Color, UA Red (A) YELLOW,STRAW      Clarity, UA Cloudy (A) CLEAR      Glucose, Ur Negative NEGATIVE,Negative mg/dl    Bilirubin, Urine Negative NEGATIVE,Negative      Ketones, Urine Negative NEGATIVE,Negative mg/dl    Specific Gravity, Urine 1.020 1.005 - 1.030      Blood, Urine Large (A) NEGATIVE,Negative      pH, Urine 5.5 5.0 - 9.0       Protein, Urine 30 mg/dL (A) NEGATIVE,Negative mg/dl    Urobilinogen, Urine 0.2 E.U./dL 0.0 - 1.0 mg/dl    Nitrite, Urine Negative NEGATIVE,Negative      Leukocyte Esterase, Urine Negative NEGATIVE,Negative     Microscopic Urinalysis   Result Value Ref Range    WBC, UA 5-9 /HPF    RBC, UA TNTC /HPF   POCT Urinalysis no Micro   Result Value Ref Range    Glucose, Ur Negative NEGATIVE,Negative mg/dl    Bilirubin, Urine Negative NEGATIVE,Negative      Ketones, Urine Negative NEGATIVE,Negative mg/dl    Specific Gravity, Urine 1.020 1.005 - 1.030      Blood, Urine Large (A) NEGATIVE,Negative      pH, Urine 5.5 5 - 9      Protein, Urine 30 (A) NEGATIVE,Negative mg/dl    Urobilinogen, Urine 0.2 0.0 - 1.0 EU/dl    Nitrite, Urine Negative NEGATIVE,Negative      Leukocyte Esterase, Urine Negative NEGATIVE,Negative      Color, UA Red      Clarity, UA Slightly Cloudy     POC CHEM 8   Result Value Ref Range    Sodium 139 136 - 145 mEq/L    Potassium 4.2 3.5 - 4.9 mEq/L    Chloride 101 98 - 107 mEq/L    Total CO2 27 21 - 32 mmol/L    Glucose 93 74 - 106 mg/dL    BUN 7 7 - 25 mg/dl    Creatinine 1.1 0.6 - 1.3 mg/dl    Hematocrit 50 40 - 54 %    Hemoglobin 17.0 13.0 - 17.2 gm/dl    Calcium, Ionized 7.35 4.40 - 5.40 mg/dL       EKG:      ED Course/Additional MDM     ED Course as of 09/30/21 2231   Mon Sep 30, 2021   2231 Pt remained stable in the emergency department, did not develop other symptoms, informed of results, and discharged in stable condition.  Follow-up with [facility] of [place].  He will call tomorrow. [BH]      ED Course User Index  [BH] Renella Cunas, PA-C         Medications   sodium chloride 0.9 % bolus 1,000 mL (0 mLs IntraVENous Stopped 09/30/21 2056)       Final Diagnosis       ICD-10-CM    1. Gross hematuria  R31.0  Disposition     Patient was discharged home in stable condition with discharge instructions on the same.     Return to the ER if condition worsens or new symptoms develop.   Follow up  with primary care as discussed.     The patient was personally evaluated by myself and general supervision by Dr. Liane Comber who agrees with the above assessment and plan.      Renella Cunas, PA-C  September 30, 2021    My signature above authenticates this document and my orders, the final    diagnosis (es), discharge prescription (s), and instructions in the Epic    record.  If you have any questions please contact [phone].     Nursing notes have been reviewed by the physician/ advanced practice    Clinician.                         Renella Cunas, PA-C  09/30/21 2231

## 2021-09-30 NOTE — ED Triage Notes (Signed)
Pt presents to ED with c/o blood in his urine.  Pt reports feeling fullness in his lower abd.  Pt denies discharge other than blood.

## 2021-09-30 NOTE — ED Notes (Signed)
0ccs post void bladder scan       Deretha Emory, LPN  94/[id number]/46 [id number]

## 2021-10-01 LAB — C.TRACHOMATIS N.GONORRHOEAE DNA
Chlamydia Trachomatis DNA, SDA: NOT DETECTED
NEISSERIA GONORRHOEAE, DNA: NOT DETECTED

## 2021-10-02 LAB — CULTURE, URINE: Culture Result: NO GROWTH

## 2021-10-29 NOTE — Progress Notes (Signed)
Physical Therapy      Benjamin Cannon was seen in our clinic from 08/06/2021 to 08/26/2021 (03 visits) for evaluation and treatment of back pain.    Patient has not returned for his remaining visits and has been discharged from our services.

## 2021-11-12 ENCOUNTER — Ambulatory Visit: Attending: Sports Medicine

## 2021-11-14 ENCOUNTER — Encounter: Attending: Student in an Organized Health Care Education/Training Program

## 2021-11-25 ENCOUNTER — Ambulatory Visit: Admit: 2021-11-25 | Discharge: 2021-11-25 | Payer: BLUE CROSS/BLUE SHIELD | Attending: Sports Medicine

## 2021-11-25 DIAGNOSIS — M4316 Spondylolisthesis, lumbar region: Secondary | ICD-10-CM

## 2021-11-25 MED ORDER — DICLOFENAC SODIUM 50 MG PO TBEC
50 MG | ORAL_TABLET | Freq: Two times a day (BID) | ORAL | 1 refills | Status: AC
Start: 2021-11-25 — End: 2022-01-15

## 2021-11-25 NOTE — Progress Notes (Signed)
AVS reviewed: yes,   HEP: N/A  Resources Provided: no   Patient questions/concerns answered: yes,   Patient verbalized understanding of treatment plan: yes,
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
## 2021-12-04 ENCOUNTER — Inpatient Hospital Stay: Admit: 2021-12-04 | Payer: BLUE CROSS/BLUE SHIELD | Attending: Sports Medicine

## 2021-12-04 DIAGNOSIS — M47816 Spondylosis without myelopathy or radiculopathy, lumbar region: Secondary | ICD-10-CM

## 2021-12-09 ENCOUNTER — Ambulatory Visit: Admit: 2021-12-09 | Discharge: 2021-12-09 | Payer: BLUE CROSS/BLUE SHIELD | Attending: Sports Medicine

## 2021-12-09 DIAGNOSIS — M5126 Other intervertebral disc displacement, lumbar region: Secondary | ICD-10-CM

## 2021-12-09 NOTE — Progress Notes (Signed)
AVS reviewed: yes,   HEP: N/A  Resources Provided: no   Patient questions/concerns answered: yes,   Patient verbalized understanding of treatment plan: yes,     Given referral to spine; told to call them if he has not been contacted to schedule by Wednesday.
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
## 2022-01-15 ENCOUNTER — Ambulatory Visit
Admit: 2022-01-15 | Discharge: 2022-01-15 | Payer: BLUE CROSS/BLUE SHIELD | Attending: Physical Medicine & Rehabilitation

## 2022-01-15 DIAGNOSIS — M5126 Other intervertebral disc displacement, lumbar region: Secondary | ICD-10-CM

## 2022-01-15 DIAGNOSIS — S161XXA Strain of muscle, fascia and tendon at neck level, initial encounter: Secondary | ICD-10-CM

## 2022-01-15 MED ORDER — KETOROLAC TROMETHAMINE 60 MG/2ML IM SOLN
60 MG/2ML | Freq: Once | INTRAMUSCULAR | Status: AC
Start: 2022-01-15 — End: 2022-01-15
  Administered 2022-01-15: 22:00:00 30 mg via INTRAMUSCULAR

## 2022-01-15 MED ORDER — PREGABALIN 100 MG PO CAPS
100 MG | ORAL_CAPSULE | ORAL | 2 refills | Status: AC
Start: 2022-01-15 — End: 2022-08-22

## 2022-01-15 NOTE — Progress Notes (Signed)
Benjamin Cannon presents today for   Chief Complaint   Patient presents with    Neck Pain    Back Pain     Mid/lower       Is someone accompanying this pt? no    Is the patient using any DME equipment during OV? no      Coordination of Care:  1. Have you been to the ER, urgent care clinic since your last visit? Yes, pt has been to the ER a couple of times since 06/25/21.   Hospitalized since your last visit? no    2. Have you seen or consulted any other health care providers outside of the [place] since your last visit? Yes, ortho, physical therapy.  Include any pap smears or colon screening. no

## 2022-01-15 NOTE — Progress Notes (Signed)
Consent was explained to the pt and signed. No questions or concerns voiced at this time. Pt given 30mg /56ml of toradol IM in left gluteus. No sign or symptoms of infection noted at injection site. There was no bleeding, swelling or leaking noted after injection. Pt handed injection information sheet to take home. Benjamin Cannon tolerated the injection well. He declined to wait the ten minutes he ambulated to checkout with out any issues.
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
## 2022-01-22 NOTE — Telephone Encounter (Signed)
Per Dr Johny Chess ov note:  Return in about 3 weeks (around 02/05/2022) for OK to overbook.

## 2022-01-22 NOTE — Telephone Encounter (Addendum)
Patient called in and was trying to schedule his next follow up appointment around 02/05/2022 but next available is not until 2024.        Please call and advise patient of appointment date and time   [phone]

## 2022-01-23 NOTE — Telephone Encounter (Signed)
Called patient and left voicemail to schedule appointment.......Marland KitchenReturn in about 3 weeks (around 02/05/2022) for OK to overbook.

## 2022-02-25 NOTE — Telephone Encounter (Signed)
I received a voice message on 02/14/2022 from [place] with [employer], she stated that the insurance plan we have on filed terminated on 12/24/2021.   I have called the patient on 02/25/2022 at 5:26PM and left him a voice message to call me back with updated information on his insurance.  He has an injection scheduled on 03/04/2022 and will need to be cancelled if we do not get an update.

## 2022-03-04 ENCOUNTER — Inpatient Hospital Stay: Payer: BLUE CROSS/BLUE SHIELD | Attending: Physical Medicine & Rehabilitation

## 2022-03-18 NOTE — ED Provider Notes (Signed)
Formatting of this note is different from the original.  ED [place]    Time of Arrival:   03/18/22 0234    Final diagnoses:   [R05.1] Acute cough (Primary)     Assessment/Plan:  The patient likely has a viral syndrome and is appropriate for outpatient care.  Will provide symptomatic treatment. CXR neg per ER read. Final read per radiology.    The patient was instructed that antibiotics are not indicated for this process and to return if worse and followup with primary care or clinic as needed    Disposition:  Home    New Prescriptions:  Discharge Medication List as of 03/18/2022  3:54 AM       START taking these medications    Details   benzonatate (TESSALON PERLES) 100 mg PO CAPS Take 1 capsule 3 times daily as needed for coughing, swallow capsules whole., Disp-30 Cap, R-0, Print         Chief Complaint:  Cold symptoms and Cough    HPI:  32 y.o. male who has the following symptoms:  Congestion and Cough  with sputum that is yellow or green.  The patient has been tolerating PO with no emesis.  This illness started 3 days ago. The patient endorses ill contacts at home.  The patient has OTC meds for relief.  Patient also states he got bit by a bed bug on his R buttock.    ROS:  Patient denies dyspnea, wheezing, vomiting, stiff neck, chest pain, and inability to take PO.    Past/ History:   History reviewed. No pertinent past medical history.    Outpatient Medications Marked as Taking for the 03/18/22 encounter [facility] Encounter)   Medication Sig Dispense Refill    benzonatate (TESSALON PERLES) 100 mg PO CAPS Take 1 capsule 3 times daily as needed for coughing, swallow capsules whole. 30 Cap 0     No Known Allergies    Exam:   Vital Signs:  Patient Vitals for the past 72 hrs:   Temp Heart Rate Resp BP BP Mean SpO2 Weight   03/18/22 0243 98.8 F (37.1 C) 84 16 154/82 (!) 106 MM HG 97 % 77.1 kg (170 lb)     General:  Nontoxic alert and oriented.  HEENT:  Mucous  membranes moist, Oropharynx: clear.  Neck:  Supple, shoddy cervical adenopathy.  Lungs:  CTA all fields, no wheezing or rales  Cor:  RRR no murmur or gallop  Abd:  Soft, nontender  Neuro:  Alert, nonfocal  Skin: Small contact dermatitis on R buttock. No wound, erythema or ttp.      Electronically signed by Wynelle Fanny, MD at 03/18/2022  4:28 AM EST

## 2022-03-18 NOTE — ED Triage Notes (Signed)
Formatting of this note might be different from the original.  Pt here with 3 days of cough and congestions  States he also has a rash to right buttock    Electronically signed by Alben Spittle, RN at 03/18/2022  2:41 AM EST

## 2022-03-18 NOTE — ED Notes (Signed)
Formatting of this note is different from the original.  Allergies verified.   PO meds administered.   See MAR.     No complaints of pain upon discharge  Condition stable.  Patient discharged to home.  Patient education was completed:  yes  Education taught to:  patient  Teaching method used was discussion and handout.  Understanding of teaching was good.  Patient was discharged ambulatory.  Discharged with self.  Valuables were given to: patient.    New Prescriptions    BENZONATATE (TESSALON PERLES) 100 MG PO CAPS    Take 1 capsule 3 times daily as needed for coughing, swallow capsules whole.       Electronically signed by Ezzie Dural., RN at 03/18/2022  4:09 AM EST

## 2022-04-16 NOTE — Telephone Encounter (Signed)
If he wants to get the block rescheduled then he should keep the appt.

## 2022-04-16 NOTE — Telephone Encounter (Signed)
I called and spoke with the pt and verified with 2 pt identifiers. I informed the pt of [place] message in regards to if he wants to reschedule the block to come to the appointment 04/17/2022. Pt confirmed that he would be here tomorrow and looks forward to it. Pt is aware to call the office if he has any other questions or concerns

## 2022-04-16 NOTE — Telephone Encounter (Signed)
Patient had to cancel an injection that he was scheduled for on 03/04/22 because he lost his insurance, and now wants to reschedule that injection because he has new insurance.     Patient has a new insurance with [employer], he was unsure of which plan, so the insurance is not undated in epic.     Patient has an appt scheduled for tomorrow, 2/22 and wants to know should he keep that appt or cancel because the 2/22 appt was going to be a follow up from the 1/9 injection.    Patient tel: [phone]

## 2022-04-17 ENCOUNTER — Ambulatory Visit
Admit: 2022-04-17 | Discharge: 2022-04-17 | Payer: BLUE CROSS/BLUE SHIELD | Attending: Physical Medicine & Rehabilitation

## 2022-04-17 DIAGNOSIS — M5126 Other intervertebral disc displacement, lumbar region: Secondary | ICD-10-CM

## 2022-04-17 NOTE — Progress Notes (Signed)
Benjamin Cannon presents today for   Chief Complaint   Patient presents with    Back Problem    Pain     12         Is someone accompanying this pt? no    Is the patient using any DME equipment during OV? no    Depression Screening:       No data to display                Learning Assessment:  Failed to redirect to the Timeline version of the REVFS SmartLink.    Abuse Screening:       No data to display                Fall Risk  Failed to redirect to the Timeline version of the REVFS SmartLink.    OPIOID RISK TOOL  Failed to redirect to the Timeline version of the REVFS SmartLink.    Coordination of Care:  1. Have you been to the ER, urgent care clinic since your last visit? no  Hospitalized since your last visit? no    2. Have you seen or consulted any other health care providers outside of the [place] since your last visit? no Include any pap smears or colon screening. no
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
## 2022-04-18 DIAGNOSIS — E860 Dehydration: Secondary | ICD-10-CM

## 2022-04-18 NOTE — ED Triage Notes (Signed)
Pt ambulatory into triage with c/o "I have a boil in my Rt groin that is back. I had taken oral and topical antibiotics but I don't think they did the trick. I've had some vomiting off and on for a couple days. And I've had a constant headache daily."

## 2022-04-18 NOTE — ED Notes (Signed)
this writer directed ambulatory pt to split/flow waiting room. pt refused and stated he would rather sit in the hallway chair. pt was advised that was okay. pt stated he was concerned about iv and believed it was not "working right"; pt was made aware that nothing was being administered at the moment and it could be flushed to verify.       Margorie John, [place]  04/18/22 2252

## 2022-04-18 NOTE — ED Notes (Signed)
Started IV, got blood work, informed pt that he is NPO, explained the reasoning for lab work, and advised him that he cannot leave the waiting room to go outside with the IV in his arm. Also informed him that we need a urine sample and that we're working on getting him a room in the back.     Pt verbalized that he understood and is the waiting room      Benjamin Cannon  04/18/22 2201

## 2022-04-18 NOTE — ED Notes (Signed)
Pt ambulatory to room 1 with Dr Veneta Penton for evaluation      Chealsea Paske, Garvin Fila, RN  04/18/22 2355

## 2022-04-18 NOTE — ED Notes (Signed)
Pt kneeling on floor in hallway stating he was going to pass out. Pt moved to hall 8 into recliner and laid back. VS taken. Liter of fluids started. Pt diaphoretic and stating both arms went numb. PT Stating he feels better after being in [place], [place], [place]  [id number]

## 2022-04-18 NOTE — ED Provider Notes (Signed)
[facility]  Emergency Department Treatment Report        Patient: Benjamin Cannon Age: 32 y.o. Sex: male    Date of Birth: February 04, 1991 Admit Date: 04/18/2022 PCP: None, None   MRN: [id number]  CSN: [id number]     Room: H08/H08 Time Dictated: 5:19 AM            Chief Complaint   Chief Complaint   Patient presents with    Abscess    Emesis       History of Present Illness   This is a 32 y.o. male Patient presents for evaluation of possible boil in his right groin/buttock area.  Reports this has been present over the last month, seems to be related to when he is working ambulatory, recently restarted work in the area has become swollen.  Has denied any fevers or chills.  This occurred 2 weeks ago.  Of significance he went to [place] a week ago, developed nausea vomiting diarrhea and had his first solid bowel movement yesterday.  No recent antibiotic no recent topical antibiotic.  Felt very dizzy when standing up in the waiting room.  Had a noted drop in blood pressure, He feels that he is dehydrated    Review of Systems   Review of Systems    As documented in HPI  Past Medical/Surgical History   No past medical history on file.  No past surgical history on file.    Social History     Social History     Socioeconomic History    Marital status: Single   Tobacco Use    Smoking status: Never    Smokeless tobacco: Never   Substance and Sexual Activity    Alcohol use: Yes    Drug use: Yes     Frequency: 1.0 times per week     Types: Marijuana (Weed)     Social Determinants of Health     Physical Activity: Unknown (01/15/2022)    Exercise Vital Sign     Days of Exercise per Week: 0 days   Intimate Partner Violence: Not At Risk (01/15/2022)    Humiliation, Afraid, Rape, and Kick questionnaire     Fear of Current or Ex-Partner: No     Emotionally Abused: No     Physically Abused: No     Sexually Abused: No       Family History   No family history on file.    Current Medications     Prior to Admission  medications    Medication Sig Start Date End Date Taking? Authorizing Provider   sulfamethoxazole-trimethoprim (BACTRIM DS;SEPTRA DS) 800-160 MG per tablet Take 1 tablet by mouth 2 times daily for 7 days 04/19/22 04/26/22 Yes Tawanna Cooler, MD   mupirocin (BACTROBAN) 2 % ointment APPLY TO AFFECTED AREA 3 TIMES A DAY  Patient not taking: Reported on 04/17/2022 01/01/22   [provider]   pregabalin (LYRICA) 100 MG capsule First month: One po qhs x 1 week, then increase to one po bid thereafter as tolerated 01/15/22 03/17/22  Creola Corn, MD        Allergies     Allergies   Allergen Reactions    Amoxicillin Hives       Physical Exam/ED Course / Medical Decision Making   Patient Vitals for the past 24 hrs:   Temp Pulse Resp BP SpO2   04/18/22 2345 -- 71 18 128/70 97 %   04/18/22 2219 99.1 F (  37.3 C) 60 20 93/62 100 %   04/18/22 2143 99 F (37.2 C) 85 18 (!) 136/107 100 %     Physical exam:  General: Well-developed, well-nourished, no apparent distress.    Head: Normocephalic atraumatic.    Eyes: Pupils midrange extraocular movements intact.  No pallor or conjunctival injection.    Nose: No rhinorrhea, inspection grossly normal.    Ears: Grossly normal to inspection, no discharge.    Mouth: Mucous membranes dry, no appreciable intraoral lesion.    Neck/Back: Trachea midline, no asymmetry.    Chest: Grossly normal inspection, symmetric chest rise.    Pulmonary: Clear to auscultation bilaterally no wheezes rhonchi or rales.    Cardiovascular: S1-S2 no murmurs rubs or gallops.    Abdomen: Soft, nontender, nondistended no guarding rebound or peritoneal signs.    GU: There is a linear fullness of the anterior portion of the right buttock, does not involve the perianal area, does not involve the scrotum.  There is no overlying erythema there is no warmth, there is no fluctuance  Extremities: Grossly normal to inspection, peripheral pulses intact  Neurologic: Alert and oriented no appreciable focal neurologic  deficit  Skin: Warm and dry    Nursing note reviewed, vital signs reviewed.    ED course:  Patient presents for evaluation of recurrent inflammation in the right anterior buttock, no clinical fluctuance I do not think that this requires drainage at this time, he was concerned about recurrent abscess, could be a cyst with recurrence of infection there is no cellulitis at this time.  Second complaint is of dehydration, is likely related to food poisoning from [place], he is clinically dehydrated, improved vital signs after fluids here.  Labs are sent, IV fluids are required, rule out acute kidney injury    Differential diagnosis: As above    Labs unremarkable    Fluids improved blood pressure, ambulatory without difficulty.  He had requested empiric antibiotic seems reasonable referred to general surgery as an outpatient    Patient's presentation, history, physical exam and laboratory evaluations were reviewed.  At this time patient was felt to be stable for outpatient management and follow with primary care/specialist.  Patient was instructed to return to the emergency department with any concerns.    Disposition:    Discharged home      Please note that this dictation was completed with Dragon, the computer voice recognition software.  Quite often unanticipated grammatical, syntax, homophones, and other interpretive errors are inadvertently transcribed by the computer software.  Please disregard these errors.  Please excuse any errors that have escaped final proofreading.             RECORDS REVIEWED:  I reviewed the patient's previous records here at [facility] and available outside facilities and note that   -     EXTERNAL RESULTS REVIEWED:    -     INDEPENDENT HISTORIAN:  History and/or plan development assisted by:   -     Severe exacerbation or progression of chronic illness:    -     Threat to body function without evaluation and management:    - dehydration, infection    SOCIAL DETERMINANTS  impacting  Evaluation and Management:   - Outpatient services not available at this time of day.   -     Comorbidities impacting Evaluation and Management:   -     Final Diagnosis       ICD-10-CM    1. Dehydration  E86.0  2. Nausea vomiting and diarrhea  R11.2     R19.7       3. Cyst of buttocks  L72.9              DISPOSITION Decision To Discharge 04/18/2022 11:47:41 PM     Diagnostic Studies   Lab:   Recent Results (from the past 12 hour(s))   CBC with Diff    Collection Time: 04/18/22 10:00 PM   Result Value Ref Range    WBC 5.5 4.0 - 11.0 1000/mm3    RBC 5.30 3.80 - 5.70 M/uL    Hemoglobin 15.6 13.0 - 18.0 gm/dl    Hematocrit 43.9 36.0 - 55.0 %    MCV 82.8 80.0 - 98.0 fL    MCH 29.4 23.0 - 34.6 pg    MCHC 35.5 30.0 - 36.0 gm/dl    Platelets 285 140 - 450 1000/mm3    MPV 9.0 6.0 - 10.0 fL    RDW 36.0 35.1 - 43.9      Nucleated RBCs 0 0 - 0      Immature Granulocytes 0.2 0.0 - 3.0 %    Neutrophils Segmented 47.9 34 - 64 %    Lymphocytes 34.1 28 - 48 %    Monocytes 16.4 (H) 1 - 13 %    Eosinophils 0.9 0 - 5 %    Basophils 0.5 0 - 3 %   CMP    Collection Time: 04/18/22 10:00 PM   Result Value Ref Range    Potassium 4.0 3.5 - 5.1 mEq/L    Chloride 101 98 - 107 mEq/L    Sodium 136 136 - 145 mEq/L    CO2 29 20 - 31 mEq/L    Glucose 102 74 - 106 mg/dl    BUN 8 (L) 9 - 23 mg/dl    Creatinine 1.11 0.70 - 1.30 mg/dl    GFR African American >60      GFR Non-African American >60      Calcium 9.7 8.7 - 10.4 mg/dl    Anion Gap 6 5 - 15 mmol/L    AST 40.0 (H) 0.0 - 33.9 U/L    ALT 32 10 - 49 U/L    Alkaline Phosphatase 71 46 - 116 U/L    Total Bilirubin 0.40 0.30 - 1.20 mg/dl    Total Protein 7.7 5.7 - 8.2 gm/dl    Albumin 4.2 3.4 - 5.0 gm/dl   POCT Glucose    Collection Time: 04/18/22 10:59 PM   Result Value Ref Range    POC Glucose 91 65 - 105 mg/dL       Imaging:    No orders to display                 New medications  Discharge Medication List as of 04/18/2022 11:49 PM             Results of lab/radiology tests were discussed with  the patient. . All questions were answered and concerns addressed.      Fannie Knee, MD    My signature above authenticates this document and my orders, the final    diagnosis (es), discharge prescription (s), and instructions in the Epic record.  If you have any questions please contact [phone].                       Tawanna Cooler, MD  04/19/22 Delrae Rend

## 2022-04-19 ENCOUNTER — Inpatient Hospital Stay
Admit: 2022-04-19 | Discharge: 2022-04-19 | Disposition: A | Discharge status: Home / Self Care | Payer: BLUE CROSS/BLUE SHIELD | Attending: Emergency Medicine

## 2022-04-19 LAB — COMPREHENSIVE METABOLIC PANEL
ALT: 32 U/L (ref 10–49)
AST: 40 U/L — ABNORMAL HIGH (ref 0.0–33.9)
Albumin: 4.2 gm/dl (ref 3.4–5.0)
Alkaline Phosphatase: 71 U/L (ref 46–116)
Anion Gap: 6 mmol/L (ref 5–15)
BUN: 8 mg/dl — ABNORMAL LOW (ref 9–23)
CO2: 29 mEq/L (ref 20–31)
Calcium: 9.7 mg/dl (ref 8.7–10.4)
Chloride: 101 mEq/L (ref 98–107)
Creatinine: 1.11 mg/dl (ref 0.70–1.30)
GFR African American: >60
GFR Non-African American: >60
Glucose: 102 mg/dl (ref 74–106)
Potassium: 4 mEq/L (ref 3.5–5.1)
Sodium: 136 mEq/L (ref 136–145)
Total Bilirubin: 0.4 mg/dl (ref 0.30–1.20)
Total Protein: 7.7 gm/dl (ref 5.7–8.2)

## 2022-04-19 LAB — CBC WITH AUTO DIFFERENTIAL
Basophils: 0.5 % (ref 0–3)
Eosinophils: 0.9 % (ref 0–5)
Hematocrit: 43.9 % (ref 36.0–55.0)
Hemoglobin: 15.6 gm/dl (ref 13.0–18.0)
Immature Granulocytes %: 0.2 % (ref 0.0–3.0)
Lymphocytes: 34.1 % (ref 28–48)
MCH: 29.4 pg (ref 23.0–34.6)
MCHC: 35.5 gm/dl (ref 30.0–36.0)
MCV: 82.8 fL (ref 80.0–98.0)
MPV: 9 fL (ref 6.0–10.0)
Monocytes: 16.4 % — ABNORMAL HIGH (ref 1–13)
Neutrophils Segmented: 47.9 % (ref 34–64)
Nucleated RBCs: 0 (ref 0–0)
Platelets: 285 10*3/uL (ref 140–450)
RBC: 5.3 M/uL (ref 3.80–5.70)
RDW: 36 (ref 35.1–43.9)
WBC: 5.5 10*3/uL (ref 4.0–11.0)

## 2022-04-19 LAB — POCT GLUCOSE: POC Glucose: 91 mg/dL (ref 65–105)

## 2022-04-19 MED ORDER — SULFAMETHOXAZOLE-TRIMETHOPRIM 800-160 MG PO TABS
800-160 | ORAL_TABLET | Freq: Two times a day (BID) | ORAL | 0 refills | Status: AC
Start: 2022-04-19 — End: 2022-04-26

## 2022-04-19 MED ORDER — SODIUM CHLORIDE 0.9 % IV BOLUS
0.9 | Freq: Once | INTRAVENOUS | Status: AC
Start: 2022-04-19 — End: 2022-04-18
  Administered 2022-04-19: 05:00:00 1000 mL via INTRAVENOUS

## 2022-04-19 MED ORDER — SODIUM CHLORIDE 0.9 % IV BOLUS
0.9 % | Freq: Once | INTRAVENOUS | Status: AC
Start: 2022-04-19 — End: 2022-04-19
  Administered 2022-04-19: 05:00:00 1000 mL via INTRAVENOUS

## 2022-04-19 NOTE — ED Notes (Signed)
Pt sitting up playing on phone. NAD noted, awaitng IVF to finish for discharge     Lenda Kelp, RN  04/19/22 [phone]

## 2022-04-19 NOTE — ED Notes (Signed)
1:06 AM  04/19/22    Discharge instructions given to patient with verbalization of understanding.  Pt accompanied by patient.  Pt discharged with the following prescriptions      Medication List        ASK your doctor about these medications      mupirocin 2 % ointment  Commonly known as: BACTROBAN     pregabalin 100 MG capsule  Commonly known as: Lyrica  First month: One po qhs x 1 week, then increase to one po bid thereafter as tolerated          .  Pt discharged to home.    Lenda Kelp, RN      Balbina Depace, Garvin Fila, [place]  04/19/22 [phone]

## 2022-04-19 NOTE — ED Notes (Signed)
Pt requesting to speak with Dr again before discharge , sharkey notified and at bedside answering additional questions     Roddie Mc Garvin Fila, RN  04/19/22 0106

## 2022-05-06 ENCOUNTER — Inpatient Hospital Stay: Payer: BLUE CROSS/BLUE SHIELD | Attending: Physical Medicine & Rehabilitation

## 2022-05-20 ENCOUNTER — Inpatient Hospital Stay: Payer: BLUE CROSS/BLUE SHIELD | Primary: General Practice

## 2022-05-20 ENCOUNTER — Inpatient Hospital Stay: Payer: BLUE CROSS/BLUE SHIELD | Attending: Physical Medicine & Rehabilitation | Primary: General Practice

## 2022-05-21 NOTE — Telephone Encounter (Signed)
I called this patient on 05/21/2022 at 8:29 am and left a voice message to call me back.  He had an appointment for ESI originally on 05/06/2022 which was rescheduled due to patient being on an antibiotic.  It was rescheduled to 05/20/2022 and he did not show up for that appointment.

## 2022-06-25 ENCOUNTER — Ambulatory Visit: Payer: BLUE CROSS/BLUE SHIELD | Attending: Physical Medicine & Rehabilitation

## 2022-08-20 ENCOUNTER — Ambulatory Visit: Payer: BLUE CROSS/BLUE SHIELD | Attending: Nurse Practitioner

## 2022-08-20 NOTE — Telephone Encounter (Signed)
Pt also no showed his spine block in March and cancelled his fu with Dr Wilford Corner in May.

## 2022-08-20 NOTE — Telephone Encounter (Signed)
Tried reaching pt this morning in regards to his appointment for today. I did leave a voice mail informing them of the appointment cancellation. NP [place] can not fill out these forms because the pt hasn't been seen 04/17/22. Pt needs to be re-evaluated with Dr. Wilford Corner before having this form completed.

## 2022-08-22 ENCOUNTER — Ambulatory Visit
Admit: 2022-08-22 | Discharge: 2022-08-22 | Payer: BLUE CROSS/BLUE SHIELD | Attending: General Practice | Primary: General Practice

## 2022-08-22 DIAGNOSIS — M5442 Lumbago with sciatica, left side: Secondary | ICD-10-CM

## 2022-08-22 DIAGNOSIS — G8929 Other chronic pain: Secondary | ICD-10-CM

## 2022-08-22 MED ORDER — GABAPENTIN 100 MG PO CAPS
100 | ORAL_CAPSULE | Freq: Three times a day (TID) | ORAL | 1 refills | Status: AC
Start: 2022-08-22 — End: 2022-10-21

## 2022-08-22 MED ORDER — TIZANIDINE HCL 4 MG PO TABS
4 | ORAL_TABLET | Freq: Three times a day (TID) | ORAL | 0 refills | Status: AC | PRN
Start: 2022-08-22 — End: ?

## 2022-08-22 MED ORDER — DICLOFENAC SODIUM 50 MG PO TBEC
50 | ORAL_TABLET | Freq: Two times a day (BID) | ORAL | 3 refills | Status: AC
Start: 2022-08-22 — End: ?

## 2022-08-22 NOTE — Progress Notes (Signed)
[facility]      MR#: [id number]    HISTORY OF PRESENT ILLNESS  History of Present Illness  The patient is a 32 year old male who presents to establish care.    The patient reports discomfort in his back, particularly when standing. He recounts a back injury a year ago, which re-injured in the shower. His last orthopedic consultation was in 03/2021, during which he was scheduled for epidural injections. However, due to recurrent infections, he was unable to proceed. He has been managing his symptoms with oral and topical Bactrim. He is scheduled to receive injections in 09/2021. An MRI revealed a herniated disc. He is currently on a weekly regimen of Lyrica. Previous attempts at physical therapy, NSAIDs, and Flexeril did not yield any improvement. 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 He expresses a desire for short-term disability. He ceased work on 07/17/2021. He reports difficulty with activities of daily living, such as bending over to put on shoes, and playing with his 82-year-old child.    Orthopedics: [facility], Dr. Wilford Corner    Past medical history:  Lower back pain  Small L4-L5 herniated disc    Social:  Tobacco use: Never  Alcohol use: Yes  Illicit drug use: Marijuana  Housing: Lives in [place] with wife, 2 kids   Employment: [employer]     Health maintenance:  Colon cancer screening: At 45  Prostate cancer screening: At 50  COVID:  Influenza:  PCV: At 65  Shingles: At 48      Current Outpatient Medications:     gabapentin (NEURONTIN) 100 MG capsule, Take 1 capsule by mouth 3 times daily for 60 days. Max Daily Amount: 300 mg, Disp: 90 capsule, Rfl: 1    tiZANidine (ZANAFLEX) 4 MG tablet, Take 1 tablet by mouth 3 times daily as needed (back pain or spasm), Disp: 30 tablet, Rfl: 0    diclofenac (VOLTAREN) 50 MG EC tablet, Take 1 tablet by mouth 2 times daily, Disp: 60 tablet, Rfl: 3    pregabalin (LYRICA) 100 MG capsule, First month: One po qhs x  1 week, then increase to one po bid thereafter as tolerated, Disp: 60 capsule, Rfl: 2    Review of Systems   Constitutional:  Negative for appetite change, chills, fatigue, fever and unexpected weight change.   HENT:  Negative for congestion, rhinorrhea and sore throat.    Eyes:  Negative for pain.   Respiratory:  Negative for cough, chest tightness and shortness of breath.    Cardiovascular:  Negative for chest pain, palpitations and leg swelling.   Gastrointestinal:  Negative for abdominal pain, constipation, diarrhea, nausea and vomiting.   Genitourinary:  Negative for dysuria and frequency.   Musculoskeletal:  Positive for arthralgias, back pain and myalgias.   Skin:  Negative for rash and wound.   Neurological:  Negative for dizziness and weakness.     BP 124/84 (Site: Left Upper Arm, Position: Sitting, Cuff Size: Medium Adult)   Pulse 68   Temp 97.5 F (36.4 C) (Temporal)   Resp 15   Ht 1.691 m (5' 6.58")   Wt 99.8 kg (220 lb)   SpO2 99%   BMI 34.90 kg/m     Physical Exam  Constitutional:       General: He is not in acute distress.     Appearance: Normal appearance. He is normal weight. He is not ill-appearing, toxic-appearing or diaphoretic.   HENT:  Head: Normocephalic and atraumatic.      Right Ear: Tympanic membrane, ear canal and external ear normal.      Left Ear: Tympanic membrane, ear canal and external ear normal.      Nose: Nose normal. No congestion or rhinorrhea.      Mouth/Throat:      Mouth: Mucous membranes are moist.      Pharynx: Oropharynx is clear. No posterior oropharyngeal erythema.   Eyes:      Extraocular Movements: Extraocular movements intact.      Conjunctiva/sclera: Conjunctivae normal.      Pupils: Pupils are equal, round, and reactive to light.   Cardiovascular:      Rate and Rhythm: Normal rate and regular rhythm.      Pulses: Normal pulses.      Heart sounds: Normal heart sounds. No murmur heard.     No gallop.   Pulmonary:      Effort: Pulmonary effort is normal. No  respiratory distress.      Breath sounds: Normal breath sounds. No wheezing or rales.   Abdominal:      General: Abdomen is flat. Bowel sounds are normal. There is no distension.      Palpations: Abdomen is soft.      Tenderness: There is no abdominal tenderness. There is no guarding.   Musculoskeletal:      Cervical back: Normal range of motion and neck supple.      Thoracic back: Spasms and tenderness present. No bony tenderness. Decreased range of motion.      Lumbar back: Spasms and tenderness present. No bony tenderness. Decreased range of motion. Negative right straight leg raise test and negative left straight leg raise test.      Right lower leg: No edema.      Left lower leg: No edema.   Feet:      Left foot:      Skin integrity: Skin integrity normal.   Lymphadenopathy:      Cervical: No cervical adenopathy.   Skin:     General: Skin is warm and dry.      Capillary Refill: Capillary refill takes less than 2 seconds.      Coloration: Skin is not jaundiced.      Findings: No bruising, erythema or rash.   Neurological:      General: No focal deficit present.      Mental Status: He is alert and oriented to person, place, and time. Mental status is at baseline.      Gait: Gait normal.   Psychiatric:         Mood and Affect: Mood normal.         Behavior: Behavior normal.         Thought Content: Thought content normal.         Judgment: Judgment normal.        Results  Imaging  MRI shows a herniated disc at L4 and L5.     ASSESSMENT and PLAN    Benjamin Cannon was seen today for new patient.    Diagnoses and all orders for this visit:    Chronic bilateral low back pain with left-sided sciatica  -     gabapentin (NEURONTIN) 100 MG capsule; Take 1 capsule by mouth 3 times daily for 60 days. Max Daily Amount: 300 mg  -     tiZANidine (ZANAFLEX) 4 MG tablet; Take 1 tablet by mouth 3 times daily as needed (back pain or spasm)  -  diclofenac (VOLTAREN) 50 MG EC tablet; Take 1 tablet by mouth 2 times daily    Lumbar  herniated disc       Assessment & Plan  1. Chronic lower back pain with sciatica and known herniated disc L4 and L5  Upon reviewing the MRI results, the patient's previous physical therapy and orthopedic consultations were reviewed. Previous attempts at pain management with Flexeril, NSAIDs, Tylenol, and Lyrica were unsuccessful due to side effects. Currently, the patient is experiencing discomfort while standing in the examination room, accompanied by decreased range of motion and pain with range of motion and movement. Bilateral muscle spasms are present, along with tenderness along the lower lumbar area. Straight leg test is negative, and sensation is intact distally. Deep tendon reflexes are intact. The patient's insurance does not cover chiropractic treatment. Short-term disability paperwork will be completed for his employment today. An evaluation for an epidural injection is pending. The patient is advised to continue his home exercise plan. Gabapentin will be initiated first at night, with a gradual increase to thrice daily. Diclofenac and tizanidine will be initiated for relief.    Follow-up  A follow-up appointment with orthopedics is scheduled for 09/2022.    Plan of care has been discussed, patient agrees with plan; all questions answered.   More than 50% of the time spent in this visit was counseling the patient about  illness and treatment options       Benjamin Pauls. Christophe Louis DO  Family Medicine  [facility]       PLEASE NOTE:   This document has been produced using voice recognition software. Unrecognized errors in transcription may be present.

## 2022-08-22 NOTE — Progress Notes (Signed)
Benjamin Cannon is a 32 y.o. male (DOB: [birth year]) presenting to address:    Chief Complaint   Patient presents with    New Patient     Back injury last month in shower       Vitals:    08/22/22 1030   BP: 124/84   Pulse: 68   Resp: 15   Temp: 97.5 F (36.4 C)   SpO2: 99%       "Have you been to the ER, urgent care clinic since your last visit?  Hospitalized since your last visit?"    NO    "Have you seen or consulted any other health care providers outside of [facility] since your last visit?"    NO    Pt requests letter for work (not [employer] comp); employed at [employer].

## 2022-10-16 ENCOUNTER — Ambulatory Visit: Payer: BLUE CROSS/BLUE SHIELD | Attending: Physical Medicine & Rehabilitation | Primary: General Practice

## 2022-10-16 NOTE — Progress Notes (Deleted)
[facility]    [address], [address]  [place], [place] [address]  Phone: [phone]  Fax: [phone]        Benjamin Cannon  DOB: [birth year]  PCP: Benedetto Goad, DO    PROGRESS NOTE      ASSESSMENT AND PLAN    There are no diagnoses linked to this encounter.    Benjamin Cannon is a 32 y.o. male ***.           HISTORY OF PRESENT ILLNESS    Benjamin Cannon is a 32 y.o. male presents for follow up of back pain. At his last OV (04/17/22), ESI L3/4 and advised on daily HEP. Advised pt reasonable to try chiropractic treatment prior to [facility] as he still has a myofacial component to his pain.            04/17/2022     9:53 AM   AMB PAIN ASSESSMENT   Location of Pain Back   Severity of Pain 6   Quality of Pain Aching;Dull   Duration of Pain Persistent   Frequency of Pain Constant   Aggravating Factors Other (Comment)   Limiting Behavior Yes   Relieving Factors Rest   Result of Injury Yes         Onset : 06/2021, Ped (on riding mower) v. 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 09/2021 Cr=1.1. 09/2021 ED visit for hematuria, referred to urology      PHYSICAL EXAMINATION    There were no vitals taken for this visit.    {DAAMBULATION:72160::"Ambulation without assistive device."}, FWB***  TTP: ***  UE strength:  ***  UE DTR: ***    TTP: ***  LE strength: ***  SLR: ***  LE DTR: ***  No edema       By signing my name below, I Einar Gip, SCRIBE, attest that this documentation has been prepared under the direction and in the presence of Wynelle Beckmann, MD.  Electronically signed: Einar Gip, SCRIBE. 10/15/2022 9:02 PM EDT    I, Wynelle Beckmann, MD, personally performed the services described in this documentation. I have authorized the scribe to complete the medical record entries input within this chart. I have reviewed the chart and agree that the record reflects my personal performance and is accurate and complete. [Electronically Signed: Wynelle Beckmann, MD. 10/15/2022 9:02 PM EDT ]    This note was created using Dragon transcription software and may contain unintended errors.

## 2022-10-22 ENCOUNTER — Ambulatory Visit
Admit: 2022-10-22 | Discharge: 2022-10-22 | Payer: BLUE CROSS/BLUE SHIELD | Attending: General Practice | Primary: General Practice

## 2022-10-22 VITALS — BP 116/64 | HR 59 | Temp 97.80000°F | Resp 14 | Ht 66.58 in | Wt 230.2 lb

## 2022-10-22 DIAGNOSIS — M5126 Other intervertebral disc displacement, lumbar region: Secondary | ICD-10-CM

## 2022-10-22 NOTE — Progress Notes (Signed)
 [facility]      MR#: [id number]    HISTORY OF PRESENT ILLNESS  History of Present Illness  The patient is a 32 year old male who presents for a follow-up.    He reports an improvement in his condition, attributing it to the medication regimen. He has not received any injections as he was informed they would not address his primary muscle-related issues. His pain is localized to the muscles, with no discomfort in the spine. He mentions a persistent bulge that occasionally swells, but was advised that this would resolve over time.    He is seeking paperwork to facilitate his return to work, albeit with some restrictions. He believes he can manage a 6-hour workday for a month before transitioning back to his regular 12-hour schedule. He feels capable of lifting up to 50 pounds and operating machinery, but requires additional time to perform tasks.    He has not undergone any medical procedures and is not currently on any prescribed medications.     Orthopedics: [facility], Dr. Bonnita Buttner     Past medical history:  Lower back pain  Small L4-L5 herniated disc     Social:  Tobacco use: Never  Alcohol use: Yes  Illicit drug use: Marijuana  Housing: Lives in [place] with wife, 2 kids   Employment: [employer]      Health maintenance:  Colon cancer screening: At 45  Prostate cancer screening: At 50  COVID:  Influenza:  PCV: At 65  Shingles: At 48      Current Outpatient Medications:     diclofenac  (VOLTAREN ) 50 MG EC tablet, Take 1 tablet by mouth 2 times daily, Disp: 60 tablet, Rfl: 3    gabapentin  (NEURONTIN ) 100 MG capsule, Take 1 capsule by mouth 3 times daily for 60 days. Max Daily Amount: 300 mg, Disp: 90 capsule, Rfl: 1    tiZANidine  (ZANAFLEX ) 4 MG tablet, Take 1 tablet by mouth 3 times daily as needed (back pain or spasm) (Patient not taking: Reported on 10/22/2022), Disp: 30 tablet, Rfl: 0    pregabalin  (LYRICA ) 100 MG capsule, First month: One po qhs x 1 week, then increase to one po bid thereafter as  tolerated, Disp: 60 capsule, Rfl: 2    Review of Systems   Constitutional:  Negative for appetite change, chills, fatigue, fever and unexpected weight change.   HENT:  Negative for congestion, rhinorrhea and sore throat.    Eyes:  Negative for pain.   Respiratory:  Negative for cough, chest tightness and shortness of breath.    Cardiovascular:  Negative for chest pain, palpitations and leg swelling.   Gastrointestinal:  Negative for abdominal pain, constipation, diarrhea, nausea and vomiting.   Genitourinary:  Negative for dysuria and frequency.   Musculoskeletal:  Negative for arthralgias, back pain and myalgias.   Skin:  Negative for rash and wound.   Neurological:  Negative for dizziness and weakness.     BP 116/64 (Site: Left Upper Arm, Position: Sitting, Cuff Size: Medium Adult)   Pulse 59   Temp 97.8 F (36.6 C) (Temporal)   Resp 14   Ht 1.691 m (5' 6.58")   Wt 104.4 kg (230 lb 3.2 oz)   SpO2 98%   BMI 36.51 kg/m     Physical Exam  Constitutional:       General: He is not in acute distress.     Appearance: Normal appearance. He is normal weight. He is not ill-appearing, toxic-appearing or diaphoretic.   HENT:  Head: Normocephalic and atraumatic.   Cardiovascular:      Rate and Rhythm: Normal rate and regular rhythm.      Pulses: Normal pulses.      Heart sounds: Normal heart sounds. No murmur heard.     No gallop.   Pulmonary:      Effort: Pulmonary effort is normal. No respiratory distress.      Breath sounds: Normal breath sounds. No wheezing or rales.   Abdominal:      General: Abdomen is flat. Bowel sounds are normal. There is no distension.      Palpations: Abdomen is soft.      Tenderness: There is no abdominal tenderness. There is no guarding.   Musculoskeletal:         General: Normal range of motion.      Cervical back: Normal range of motion and neck supple.      Right lower leg: No edema.      Left lower leg: No edema.   Feet:      Left foot:      Skin integrity: Skin integrity normal.    Lymphadenopathy:      Cervical: No cervical adenopathy.   Skin:     General: Skin is warm and dry.      Capillary Refill: Capillary refill takes less than 2 seconds.      Coloration: Skin is not jaundiced.      Findings: No bruising, erythema or rash.   Neurological:      General: No focal deficit present.      Mental Status: He is alert and oriented to person, place, and time. Mental status is at baseline.      Gait: Gait normal.   Psychiatric:         Mood and Affect: Mood normal.         Behavior: Behavior normal.         Thought Content: Thought content normal.         Judgment: Judgment normal.        Results       ASSESSMENT and PLAN    Yannick was seen today for back pain.    Diagnoses and all orders for this visit:    Lumbar herniated disc    Chronic bilateral low back pain with left-sided sciatica       Assessment & Plan  1.  Lower back pain, herniated disc, return to work  He is ready to return to work with 30 days of restrictions. A new work note was provided, allowing him to work for 4-7 hours a day for a month before transitioning back to his regular 12-hour schedule. He is permitted to lift up to 50 pounds and operate machinery but may require additional time to perform tasks.  He no longer requires medications to control his pain.  He is otherwise feeling well.      Plan of care has been discussed, patient agrees with plan; all questions answered.   More than 50% of the time spent in this visit was counseling the patient about  illness and treatment options     Follow up in 9 to 12 months for physical    Chinita Cough. Maurilio Southward DO  Family Medicine  [facility]       PLEASE NOTE:   This document has been produced using voice recognition software. Unrecognized errors in transcription may be present.

## 2022-10-22 NOTE — Progress Notes (Signed)
 Benjamin Cannon is a 32 y.o. male (DOB: November 24, 1990) presenting to address:    Chief Complaint   Patient presents with    Back Pain       Vitals:    10/22/22 1029   BP: 116/64   Pulse: 59   Resp: 14   Temp: 97.8 F (36.6 C)   SpO2: 98%       Have you been to the ER, urgent care clinic since your last visit?  Hospitalized since your last visit?    NO    "Have you seen or consulted any other health care providers outside of [facility] since your last visit?"    NO

## 2022-12-17 ENCOUNTER — Encounter
Admit: 2022-12-17 | Discharge: 2022-12-17 | Payer: BLUE CROSS/BLUE SHIELD | Attending: General Practice | Primary: General Practice

## 2022-12-17 ENCOUNTER — Inpatient Hospital Stay: Admit: 2022-12-17 | Payer: BLUE CROSS/BLUE SHIELD | Primary: General Practice

## 2022-12-17 DIAGNOSIS — R1030 Lower abdominal pain, unspecified: Secondary | ICD-10-CM

## 2022-12-17 LAB — AMB POC URINALYSIS DIP STICK AUTO W/O MICRO
Bilirubin, Urine, POC: NEGATIVE
Blood, Urine, POC: NEGATIVE
Glucose, Urine, POC: NEGATIVE
Ketones, Urine, POC: NEGATIVE
Leukocyte Esterase, Urine, POC: NEGATIVE
Nitrite, Urine, POC: NEGATIVE
Specific Gravity, Urine, POC: 1.02 (ref 1.001–1.035)
Urobilinogen, POC: 1
pH, Urine, POC: 7 (ref 4.6–8.0)

## 2022-12-17 NOTE — Progress Notes (Signed)
[facility]      MR#: [id number]    HISTORY OF PRESENT ILLNESS  History of Present Illness  The patient is a 32 year old male who presents acutely with abdominal pain.    He suspects a urinary tract infection (UTI) due to an unusual sensation in his lower abdomen, near the bladder. Initially, he experienced intermittent sharp pain, which later became constant, persisting overnight and into the next day. He attempted to alleviate the discomfort by consuming ginger and cranberry juice. While the pain has subsided, he still experiences a mild sensation. He reports no pain during urination but describes a peculiar feeling. His urine appears dark, but he reports no discharge. He also experienced back pain for four days. He has no history of kidney stones. The pain was more pronounced when sitting in a car and was somewhat relieved when lying down. He reports no leg or testicular pain. He recalls a previous injury in the same area, which he believes may have resulted in a tear. He has been consuming cranberry juice and water, which seemed to help initially, but he noticed a recurrence of the sensation after a day of not drinking water.    He is also dealing with plantar fasciitis, a condition he has experienced before. He has been soaking his foot in Epsom salt as a form of treatment.    He is requesting to extend his accommodation paperwork.    Orthopedics: [facility] Dear, Dr. Wilford Corner     Past medical history:  Lower back pain  Small L4-L5 herniated disc     Social:  Tobacco use: Never  Alcohol use: Yes  Illicit drug use: Marijuana  Housing: Lives in [place] with wife, 2 kids   Employment: [employer]      Health maintenance:  Colon cancer screening: At 45  Prostate cancer screening: At 50  COVID:  Influenza:  PCV: At 65  Shingles: At 48    No current outpatient medications on file.    Review of Systems   Constitutional:  Negative for appetite change, chills, fatigue, fever and unexpected weight change.   HENT:   Negative for congestion, rhinorrhea and sore throat.    Eyes:  Negative for pain.   Respiratory:  Negative for cough, chest tightness and shortness of breath.    Cardiovascular:  Negative for chest pain, palpitations and leg swelling.   Gastrointestinal:  Positive for abdominal pain. Negative for constipation, diarrhea, nausea and vomiting.   Genitourinary:  Positive for flank pain and testicular pain. Negative for difficulty urinating, frequency, genital sores, hematuria, penile discharge, penile pain, penile swelling and scrotal swelling.   Musculoskeletal:  Negative for arthralgias, back pain and myalgias.   Skin:  Negative for rash and wound.   Neurological:  Negative for dizziness and weakness.     BP 124/66 (Site: Left Upper Arm, Position: Sitting, Cuff Size: Medium Adult)   Pulse 70   Temp 98.1 F (36.7 C) (Temporal)   Resp 14   Ht 1.691 m (5' 6.58")   Wt 103.2 kg (227 lb 9.6 oz)   SpO2 96%   BMI 36.10 kg/m     Physical Exam  Constitutional:       General: He is not in acute distress.     Appearance: Normal appearance. He is normal weight. He is not ill-appearing, toxic-appearing or diaphoretic.   HENT:      Head: Normocephalic and atraumatic.   Cardiovascular:      Rate and Rhythm: Normal rate and regular  rhythm.      Pulses: Normal pulses.      Heart sounds: Normal heart sounds. No murmur heard.     No gallop.   Pulmonary:      Effort: Pulmonary effort is normal. No respiratory distress.      Breath sounds: Normal breath sounds. No wheezing or rales.   Abdominal:      General: Abdomen is flat. Bowel sounds are normal. There is no distension.      Palpations: Abdomen is soft.      Tenderness: There is no abdominal tenderness. There is no right CVA tenderness, left CVA tenderness or guarding. Negative signs include Murphy's sign.   Musculoskeletal:         General: Normal range of motion.      Cervical back: Normal range of motion and neck supple.      Right lower leg: No edema.      Left lower leg:  No edema.      Left foot: No swelling, deformity, bunion, tenderness, bony tenderness or crepitus. Normal pulse.   Feet:      Left foot:      Skin integrity: Skin integrity normal.   Lymphadenopathy:      Cervical: No cervical adenopathy.   Skin:     General: Skin is warm and dry.      Capillary Refill: Capillary refill takes less than 2 seconds.      Coloration: Skin is not jaundiced.      Findings: No bruising, erythema or rash.   Neurological:      General: No focal deficit present.      Mental Status: He is alert and oriented to person, place, and time. Mental status is at baseline.      Gait: Gait normal.   Psychiatric:         Mood and Affect: Mood normal.         Behavior: Behavior normal.         Thought Content: Thought content normal.         Judgment: Judgment normal.        Results       ASSESSMENT and PLAN    Hrishikesh was seen today for abdominal pain.    Diagnoses and all orders for this visit:    Lower abdominal pain  -     AMB POC URINALYSIS DIP STICK AUTO W/O MICRO  -     Culture, Urine; Future  -     Chlamydia, Gonorrhea, Trichomoniasis; Future       Assessment & Plan  1. Abdominal pain.  He reports dark urine and a weird feeling in the lower abdomen, suggesting a possible urinary tract infection (UTI). There is no pain during urination, but he feels discomfort. A urine culture will be performed to rule out sexually transmitted diseases (STDs). Abdominal pain is improving.  Considered Nephrolithiasis but no blood in urine.  Continue current treatment plan and will notify if any findings on urine but likely resolved.      2. Plantar fasciitis.  He experiences pain in the arch of his foot. He was advised to apply ice, perform stretching exercises, and use appropriate footwear. He was also instructed to freeze a water bottle and roll it under his foot to stretch the fascia and provide relief. Naproxen was recommended for pain management.    He will be provided with a letter to extend his accommodation  paperwork.      Plan of care  has been discussed, patient agrees with plan; all questions answered.   More than 50% of the time spent in this visit was counseling the patient about  illness and treatment options       Mena Pauls. Christophe Louis DO  Family Medicine  [facility]       PLEASE NOTE:   This document has been produced using voice recognition software. Unrecognized errors in transcription may be present.

## 2022-12-17 NOTE — Progress Notes (Signed)
Benjamin Cannon is a 32 y.o. male (DOB: 1990/04/01) presenting to address:    Chief Complaint   Patient presents with    Abdominal Pain       Vitals:    12/17/22 1001   BP: 124/66   Pulse: 70   Resp: 14   Temp: 98.1 F (36.7 C)   SpO2: 96%       "Have you been to the ER, urgent care clinic since your last visit?  Hospitalized since your last visit?"    NO    "Have you seen or consulted any other health care providers outside of [facility] since your last visit?"    NO    Pt declined flu vaccine.

## 2022-12-18 LAB — CULTURE, URINE: Colony count: 20000

## 2023-02-05 NOTE — Telephone Encounter (Signed)
Dr. Meade Maw called wanting to speak to Dr. Christophe Louis about pt's disability forms. She can be reached at [phone].

## 2023-02-05 NOTE — Telephone Encounter (Signed)
Returned call and discussed paperwork from August with work restrictions as previously faxed.        Benjamin Cannon. Christophe Louis, DO  [employer]

## 2023-04-14 ENCOUNTER — Encounter: Payer: BLUE CROSS/BLUE SHIELD | Attending: General Practice | Primary: General Practice

## 2023-04-14 ENCOUNTER — Emergency Department: Admit: 2023-04-14 | Payer: BLUE CROSS/BLUE SHIELD | Primary: General Practice

## 2023-04-14 ENCOUNTER — Inpatient Hospital Stay
Admit: 2023-04-14 | Discharge: 2023-04-14 | Disposition: A | Discharge status: Home / Self Care | Attending: Student in an Organized Health Care Education/Training Program

## 2023-04-14 DIAGNOSIS — J101 Influenza due to other identified influenza virus with other respiratory manifestations: Secondary | ICD-10-CM

## 2023-04-14 DIAGNOSIS — B349 Viral infection, unspecified: Secondary | ICD-10-CM

## 2023-04-14 LAB — GROUP A STREP SCREEN BY PCR: Strep A Ag: NOT DETECTED

## 2023-04-14 LAB — COVID-19, FLU A/B, AND RSV COMBO
Influenza A PCR: POSITIVE — AB
Influenza B by PCR: NEGATIVE
RSV A/B PCR: NEGATIVE
SARS-CoV-2: NEGATIVE

## 2023-04-14 MED ORDER — PREDNISONE 20 MG PO TABS
20 | ORAL_TABLET | Freq: Every day | ORAL | 0 refills | Status: AC
Start: 2023-04-14 — End: 2023-04-17

## 2023-04-14 MED ORDER — ALBUTEROL SULFATE (2.5 MG/3ML) 0.083% IN NEBU
RESPIRATORY_TRACT | Status: AC
Start: 2023-04-14 — End: 2023-04-14
  Administered 2023-04-14: 09:00:00 5 mg via RESPIRATORY_TRACT

## 2023-04-14 MED ORDER — ALBUTEROL SULFATE HFA 108 (90 BASE) MCG/ACT IN AERS
10890 | Freq: Four times a day (QID) | RESPIRATORY_TRACT | 0 refills | Status: AC | PRN
Start: 2023-04-14 — End: ?

## 2023-04-14 MED ORDER — IBUPROFEN 600 MG PO TABS
600 | ORAL | Status: AC
Start: 2023-04-14 — End: 2023-04-14
  Administered 2023-04-14: 09:00:00 600 mg via ORAL

## 2023-04-14 MED ORDER — ACETAMINOPHEN 500 MG PO TABS
500 | ORAL | Status: AC
Start: 2023-04-14 — End: 2023-04-14
  Administered 2023-04-14: 09:00:00 1000 mg via ORAL

## 2023-04-14 MED ORDER — ONDANSETRON 4 MG PO TBDP
4 | ORAL_TABLET | Freq: Three times a day (TID) | ORAL | 0 refills | Status: DC | PRN
Start: 2023-04-14 — End: 2023-08-26

## 2023-04-14 MED ORDER — DEXAMETHASONE 4 MG PO TABS
4 | ORAL | Status: AC
Start: 2023-04-14 — End: 2023-04-14
  Administered 2023-04-14: 09:00:00 10 mg via ORAL

## 2023-04-14 MED ORDER — BENZONATATE 100 MG PO CAPS
100 | ORAL_CAPSULE | Freq: Three times a day (TID) | ORAL | 0 refills | Status: DC | PRN
Start: 2023-04-14 — End: 2023-08-26

## 2023-04-14 MED ORDER — IBUPROFEN 800 MG PO TABS
800 | ORAL_TABLET | Freq: Three times a day (TID) | ORAL | 0 refills | Status: DC | PRN
Start: 2023-04-14 — End: 2023-08-26

## 2023-04-14 MED FILL — IBUPROFEN 600 MG PO TABS: 600 MG | ORAL | Qty: 1

## 2023-04-14 MED FILL — DEXAMETHASONE 4 MG PO TABS: 4 MG | ORAL | Qty: 3

## 2023-04-14 MED FILL — ALBUTEROL SULFATE (2.5 MG/3ML) 0.083% IN NEBU: RESPIRATORY_TRACT | Qty: 6

## 2023-04-14 MED FILL — ACETAMINOPHEN EXTRA STRENGTH 500 MG PO TABS: 500 MG | ORAL | Qty: 2

## 2023-04-14 NOTE — Discharge Instructions (Addendum)
-  Rest and encourage fluids.  -Use the inhaler as directed as needed for cough/wheezing/shortness of breath.  -Take prednisone as directed.  -Take ondansetron, Zofran ODT, as directed as needed for nausea.  -For any additional diarrhea you may take over-the-counter Imodium.  -Take benzoate, Tessalon Perles, as directed as needed for cough.  -Take ibuprofen as directed as needed for pain, fever.  -For sore throat also recommend warm salt water gargles, Chloraseptic spray, throat lozenges, and hot tea.  -Follow-up with your primary care physician in 5 to 7 days for recheck.  -Return to ED immediately for new or worsening symptoms.

## 2023-04-14 NOTE — ED Notes (Signed)
Nasal swab and Throat swab collected and sent to lab.      Benjamin Cannon  04/14/23 0981

## 2023-04-14 NOTE — ED Notes (Signed)
Went over discharge instruction with patient.  Patient given opportunity to ask questions--  Patient verbalized understanding with instructions.  Patient in no distress at time of discharge        Danae Orleans, RN  04/14/23 [phone]

## 2023-04-14 NOTE — ED Notes (Signed)
Influenza A positive.  COVID, RSV, and strep negative.  Patient called and informed of results.     Ahmed Prima, PA-C  04/14/23 [phone]

## 2023-04-14 NOTE — ED Provider Notes (Signed)
[facility]  Emergency Department Treatment Report    Patient: Benjamin Cannon Age: 33 y.o. Sex: male    Date of Birth: 1990/07/06 Admit Date: 04/14/2023 PCP: Benedetto Goad, DO   MRN: [id number]  CSN: [id number]  Attending: Ardyth Harps, DO   Room: H06/H06 Time Dictated: 4:33 AM APP: Ahmed Prima, PA-C     Patient seen and examined using standard PPE: surgical mask, gloves.    Chief Complaint   Chief Complaint   Patient presents with    Pharyngitis    Cough     Fever         History of Present Illness   32 y.o. male for the past 4 to 5 days complains of persistent cough, chest congestion--feels like mucus is stuck in his chest, shortness of breath, wheezing, sore throat, nausea, vomiting, posttussive emesis, nonbloody diarrhea, fevers, chills, malaise, and myalgias.    Review of Systems   Constitutional: See above.   Eyes: No visual symptoms  ENT: See above. No other URI symptoms  RESP: See above.   CV: No chest pain  GI: See above. No abdominal pain  GU: No urinary symptoms  MSK: See above.   SKIN: No rash  NEURO: No headaches    Past Medical/Surgical History   No past medical history on file.  No past surgical history on file.  None.     Social History     Social History     Socioeconomic History    Marital status: Single     Spouse name: Not on file    Number of children: Not on file    Years of education: Not on file    Highest education level: Not on file   Occupational History    Not on file   Tobacco Use    Smoking status: Never    Smokeless tobacco: Never   Vaping Use    Vaping status: Not on file   Substance and Sexual Activity    Alcohol use: Yes    Drug use: Yes     Frequency: 1.0 times per week     Types: Marijuana Sheran Fava)    Sexual activity: Yes     Partners: Female   Other Topics Concern    Not on file   Social History Narrative    Not on file     Social Determinants of Health     Financial Resource Strain: Low Risk  (08/22/2022)    Overall Financial Resource Strain (CARDIA)      Difficulty of Paying Living Expenses: Not hard at all   Food Insecurity: No Food Insecurity (08/22/2022)    Hunger Vital Sign     Worried About Running Out of Food in the Last Year: Never true     Ran Out of Food in the Last Year: Never true   Transportation Needs: Unknown (08/22/2022)    PRAPARE - Therapist, art (Medical): Not on file     Lack of Transportation (Non-Medical): No   Physical Activity: Inactive (08/21/2022)    Exercise Vital Sign     Days of Exercise per Week: 0 days     Minutes of Exercise per Session: 0 min   Stress: Not on file   Social Connections: Not on file   Intimate Partner Violence: Not At Risk (01/15/2022)    Humiliation, Afraid, Rape, and Kick questionnaire     Fear of Current or Ex-Partner: No  Emotionally Abused: No     Physically Abused: No     Sexually Abused: No   Housing Stability: Unknown (08/22/2022)    Housing Stability Vital Sign     Unable to Pay for Housing in the Last Year: Not on file     Number of Places Lived in the Last Year: Not on file     Unstable Housing in the Last Year: No         Family History   No family history on file.    Current Medications     No current facility-administered medications for this encounter.     Current Outpatient Medications   Medication Sig Dispense Refill    ondansetron (ZOFRAN-ODT) 4 MG disintegrating tablet Take 1 tablet by mouth every 8 hours as needed for Nausea or Vomiting 12 tablet 0    predniSONE (DELTASONE) 20 MG tablet Take 3 tablets by mouth daily for 3 days 9 tablet 0    albuterol sulfate HFA (VENTOLIN HFA) 108 (90 Base) MCG/ACT inhaler Inhale 2 puffs into the lungs 4 times daily as needed for Wheezing or Shortness of Breath 18 g 0    benzonatate (TESSALON) 100 MG capsule Take 1 capsule by mouth 3 times daily as needed for Cough 30 capsule 0    ibuprofen (ADVIL;MOTRIN) 800 MG tablet Take 1 tablet by mouth every 8 hours as needed for Pain 30 tablet 0       Allergies     Allergies   Allergen Reactions     Amoxicillin Hives       Physical Exam   Patient Vitals for the past 24 hrs:   Temp Pulse Resp BP SpO2   04/14/23 0330 98.4 F (36.9 C) 79 20 (!) 150/97 99 %     Constitutional: Well developed and well nourished.   EYES: Conjuctivae clear, lids normal.  PERRLA.   ENT: EARS: Canals and TM's are clear bilaterally. MOUTH/THROAT: Oral mucosa is pink, moist and without lesions.  Uvula midline. Posterior pharynx no erythema, no tonsillar swelling or exudates. Airway patent, no stridor.   RESP: Faint wheeze. No rhonchi, crackles, or rales. No respiratory distress, tachypnea, or accessory muscle use.  Converses without difficulty.  CV: Regular rate and rhythm.  GI: Abdomen soft, nontender to palpation.  MSK: Moves all 4 extremities spontaneously.   SKIN: Warm and dry.  NEURO: Alert, oriented x 3.      Impression and Management Plan     Differential diagnoses: Viral etiology, COVID, influenza, RSV, pneumonia    Procedure   Procedures    Diagnostic Studies   Lab:   Results for orders placed or performed during the hospital encounter of 04/14/23   COVID-19, Flu A/B, and RSV Combo    Specimen: Nasopharyngeal Swab; Nasal swab    Narrative    Pregnant:->No   XR CHEST (2 VW)    Narrative    Exam: PA and lateral chest    Clinical indication: cough x 5 days with associated upper respiratory symptoms    Comparison: None    Results:  No consolidation.  No pleural effusions.        No pneumothorax.    Heart normal.  Mediastinal contours are normal.     No free air is seen under the hemidiaphragms.   Osseous structures intact.      Impression    IMPRESSION:  No acute disease.    Electronically signed by: Earline Mayotte, MD 04/14/2023 4:04 AM EST  Workstation ID: [id number]         Imaging: My personal interpretation of the CXR is: No infiltrate.    ED Course/MDM       Medications   acetaminophen (TYLENOL) tablet 1,000 mg (1,000 mg Oral Given 04/14/23 0402)   ibuprofen (ADVIL;MOTRIN) tablet 600 mg (600 mg Oral Given  04/14/23 0402)   dexAMETHasone (DECADRON) tablet 10 mg (10 mg Oral Given 04/14/23 0402)   albuterol (PROVENTIL) (2.5 MG/3ML) 0.083% nebulizer solution 5 mg (5 mg Nebulization Given 04/14/23 0402)       Threat to body function without evaluation and management:  Pulmonary.    SOCIAL DETERMINANTS  impacting Evaluation and Management: Income, medical bills, transportation, support systems, health coverage, provider availability.     NARRATIVE:  Chest x-ray no acute disease.    The patient was treated symptomatically as listed above.    On examination the patient looks well.  Lungs are clear.  The patient is not objectively short of breath.  Normal oxygen saturation.  No high risk factors that suggest the need for admission.  Have encouraged isolation, appropriate home care, close follow-up with primary care, and have discussed reasons to return.    Covid, flu, rsv swab sent. Patient aware that test results will return in several hours and they will only be called if test results are positive.  Encouraged to check for test results online in MyChart.  For the time being instructed to self quarantine per CDC guidelines.   Fluids and supportive measures at home.    Albuterol MDI, Tessalon Perles, ibuprofen, Zofran ODT, and prednisone prescription given.   Return to the ED for inability to hold down fluids, concerns for dehydration, increased weakness or fever, difficulty breathing, worsening condition.    Final Diagnosis       ICD-10-CM    1. Viral illness  B34.9           Disposition   Home in stable condition.     Ahmed Prima, PA-C  April 14, 2023    The patient was fully discussed with attending Ardyth Harps, DO who agrees with the above assessment and plan.    Nursing notes have been reviewed by the physician/advanced practice Clinician.    Dragon medical dictation was used for portions of this report. Unintended grammatical errors may occur.    My signature above authenticates this document and my orders,  the final diagnosis (es), discharge prescription (s), and instructions in the Epic record. If you have any questions please contact [phone].     Antony Salmon, Jill Side, PA-C  04/14/23 [phone]

## 2023-04-14 NOTE — ED Triage Notes (Addendum)
Pt arrives ambulatory c/o  flue like symptoms x 5 days. Pt reports fever , N/V/D, sore throat and cough.     Pt states he feels like he can't get mucus out of his chest.     Reports his throat hurts so bad he can't talk.

## 2023-08-26 ENCOUNTER — Emergency Department: Admit: 2023-08-26 | Payer: PRIVATE HEALTH INSURANCE | Primary: General Practice

## 2023-08-26 ENCOUNTER — Inpatient Hospital Stay
Admit: 2023-08-26 | Discharge: 2023-08-27 | Disposition: A | Discharge status: Home / Self Care | Payer: PRIVATE HEALTH INSURANCE | Arrived: VH | Attending: Emergency Medicine

## 2023-08-26 DIAGNOSIS — S0990XA Unspecified injury of head, initial encounter: Principal | ICD-10-CM

## 2023-08-26 DIAGNOSIS — S0093XA Contusion of unspecified part of head, initial encounter: Principal | ICD-10-CM

## 2023-08-26 LAB — CBC WITH AUTO DIFFERENTIAL
Basophils %: 0.5 % (ref 0.0–2.0)
Basophils Absolute: 0.04 10*3/uL (ref 0.00–0.10)
Eosinophils %: 0.5 % (ref 0.0–5.0)
Eosinophils Absolute: 0.04 10*3/uL (ref 0.00–0.40)
Hematocrit: 42.9 % (ref 36.0–48.0)
Hemoglobin: 14.9 g/dL (ref 13.0–16.0)
Immature Granulocytes %: 0.1 % (ref 0.0–0.5)
Immature Granulocytes Absolute: 0.01 10*3/uL (ref 0.00–0.04)
Lymphocytes %: 27.5 % (ref 21.0–52.0)
Lymphocytes Absolute: 2.18 10*3/uL (ref 0.90–3.60)
MCH: 29.9 pg (ref 24.0–34.0)
MCHC: 34.7 g/dL (ref 31.0–37.0)
MCV: 86.1 FL (ref 78.0–100.0)
MPV: 9.5 FL (ref 9.2–11.8)
Monocytes %: 8.7 % (ref 3.0–10.0)
Monocytes Absolute: 0.69 10*3/uL (ref 0.05–1.20)
Neutrophils %: 62.7 % (ref 40.0–73.0)
Neutrophils Absolute: 4.97 10*3/uL (ref 1.80–8.00)
Nucleated RBCs: 0 /100{WBCs}
Platelets: 339 10*3/uL (ref 135–420)
RBC: 4.98 M/uL (ref 4.35–5.65)
RDW: 11.9 % (ref 11.6–14.5)
WBC: 7.9 10*3/uL (ref 4.6–13.2)
nRBC: 0 10*3/uL (ref 0.00–0.01)

## 2023-08-26 LAB — BASIC METABOLIC PANEL
Anion Gap: 11 mmol/L (ref 7–16)
BUN/Creatinine Ratio: 9
BUN: 9 mg/dL (ref 6–23)
CO2: 27 mmol/L (ref 21–32)
Calcium: 10.1 mg/dL (ref 8.5–10.1)
Chloride: 103 mmol/L (ref 98–107)
Creatinine: 1.05 mg/dL (ref 0.6–1.3)
Est, Glom Filt Rate: >90 mL/min/{1.73_m2} (ref >60)
Glucose: 95 mg/dL (ref 74–108)
Potassium: 3.9 mmol/L (ref 3.5–5.5)
Sodium: 141 mmol/L (ref 136–145)

## 2023-08-26 LAB — TROPONIN: Troponin T: <6 ng/L (ref 0.0–22.0)

## 2023-08-26 MED ORDER — ACETAMINOPHEN 500 MG PO TABS
500 | ORAL_TABLET | Freq: Four times a day (QID) | ORAL | 0 refills | Status: AC | PRN
Start: 2023-08-26 — End: 2023-09-02

## 2023-08-26 MED ORDER — ONDANSETRON HCL 4 MG PO TABS
4 | ORAL_TABLET | Freq: Three times a day (TID) | ORAL | 0 refills | Status: AC | PRN
Start: 2023-08-26 — End: ?

## 2023-08-26 NOTE — ED Notes (Signed)
 Discharge teaching provided to pt regarding treatment received, medications prescribed, and follow-up care. Pt verbalized understanding directions and follow up care. Pt left ambulatory with discharge paperwork in hand.

## 2023-08-26 NOTE — ED Provider Notes (Signed)
 7:00 p,:Pt care assumed from [facility]  [place].  , ED provider. Pt complaint(s), current treatment plan, progression and available diagnostic results have been discussed thoroughly. The patient was seen and evaluated on her shift.   Rounding occurred: Yes  Intended Disposition: D/C home  Pending diagnostic reports and/or labs (please list): CT head and neck    DX contusion of head    Disp: D/C home    F/U PCP in 2 to 3 days.   Tylenol  2 tab po q  6 h prn headache.  Return to ER prn.      Delores Vinie NOVAK, MD  08/29/23 [phone]

## 2023-08-26 NOTE — ED Notes (Signed)
 Patient requested IV to be removed. Patient educated on importance of keeping the IV.

## 2023-08-26 NOTE — ED Provider Notes (Cosign Needed)
 EMERGENCY DEPARTMENT HISTORY AND PHYSICAL EXAM      Date: 08/26/2023  Patient Name: Benjamin Cannon    History of Presenting Illness     Chief Complaint   Patient presents with    Nausea    Head Injury       History (Context): Benjamin Cannon is a 33 y.o. male who presents to the ED today with chief complaint of R frontal headache and neck pain after head injury that occurred this morning around 1 AM.  Patient notes that he woke up, ambulated to the kitchen, fell and struck his head against the wall.  Denies loss of consciousness but notes nausea and 1 episode of emesis.  Patient notes that he had a telemedicine visit this afternoon but was instructed to come to the ED for a CT scan.  Patient denies dizziness, changes in vision, slurred speech, facial droop, extremity weakness, numbness or tingling, chest pain, dyspnea.  Denies blood thinner use.  Did not take any medication for symptoms prior to arrival, declines pain medication in the ED      PCP: Maryjane Nola RAMAN, DO    No current facility-administered medications for this encounter.     Current Outpatient Medications   Medication Sig Dispense Refill    ondansetron  (ZOFRAN ) 4 MG tablet Take 1 tablet by mouth every 8 hours as needed for Nausea or Vomiting 12 tablet 0    acetaminophen  (TYLENOL ) 500 MG tablet Take 2 tablets by mouth every 6 hours as needed for Pain 56 tablet 0    albuterol  sulfate HFA (VENTOLIN  HFA) 108 (90 Base) MCG/ACT inhaler Inhale 2 puffs into the lungs 4 times daily as needed for Wheezing or Shortness of Breath 18 g 0       Past History     Past Medical History:   No past medical history on file.    Past Surgical History:  No past surgical history on file.    Family History:  No family history on file.    Social History:   Social History     Tobacco Use    Smoking status: Never    Smokeless tobacco: Never   Substance Use Topics    Alcohol use: Yes    Drug use: Yes     Frequency: 1.0 times per week     Types: Marijuana Oda)        Allergies:  Allergies   Allergen Reactions    Amoxicillin Hives         Physical Exam     Vitals:    08/26/23 1845   BP: (!) 150/100   Pulse: 56   Resp: 16   Temp: 98.2 F (36.8 C)   TempSrc: Oral   SpO2: 100%   Weight: 97.5 kg (215 lb)   Height: 1.727 m (5' 8)       Physical Exam  Vitals and nursing note reviewed.   Constitutional:       General: He is not in acute distress.     Appearance: Normal appearance. He is not ill-appearing, toxic-appearing or diaphoretic.   HENT:      Head: Normocephalic and atraumatic. No raccoon eyes, Battle's sign, abrasion or contusion.      Jaw: There is normal jaw occlusion.      Nose: Nose normal.      Mouth/Throat:      Mouth: Mucous membranes are moist.   Eyes:      Pupils: Pupils are equal, round, and reactive  to light.   Cardiovascular:      Rate and Rhythm: Normal rate and regular rhythm.   Pulmonary:      Effort: Pulmonary effort is normal.      Breath sounds: Normal breath sounds.   Musculoskeletal:         General: Normal range of motion.      Cervical back: Normal range of motion and neck supple. Bony tenderness present. No swelling or deformity. Normal range of motion.      Thoracic back: Normal.      Lumbar back: Normal.   Skin:     General: Skin is warm.      Findings: No rash.   Neurological:      General: No focal deficit present.      Mental Status: He is alert and oriented to person, place, and time.      Cranial Nerves: No cranial nerve deficit.      Sensory: No sensory deficit.      Motor: No weakness.      Coordination: Coordination normal.      Gait: Gait normal.         Diagnostic Study Results     Labs -     Recent Results (from the past 12 hours)   EKG 12 Lead    Collection Time: 08/26/23  7:01 PM   Result Value Ref Range    Ventricular Rate 57 BPM    Atrial Rate 57 BPM    P-R Interval 182 ms    QRS Duration 100 ms    Q-T Interval 362 ms    QTc Calculation (Bazett) 352 ms    P Axis 16 degrees    R Axis -2 degrees    T Axis -5 degrees    Diagnosis        Sinus bradycardia with premature atrial complexes  Nonspecific T wave abnormality  Abnormal ECG  No previous ECGs available     CBC with Auto Differential    Collection Time: 08/26/23  7:18 PM   Result Value Ref Range    WBC 7.9 4.6 - 13.2 K/uL    RBC 4.98 4.35 - 5.65 M/uL    Hemoglobin 14.9 13.0 - 16.0 g/dL    Hematocrit 57.0 63.9 - 48.0 %    MCV 86.1 78.0 - 100.0 FL    MCH 29.9 24.0 - 34.0 PG    MCHC 34.7 31.0 - 37.0 g/dL    RDW 88.0 88.3 - 85.4 %    Platelets 339 135 - 420 K/uL    MPV 9.5 9.2 - 11.8 FL    Nucleated RBCs 0.0 0 PER 100 WBC    nRBC 0.00 0.00 - 0.01 K/uL    Neutrophils % 62.7 40.0 - 73.0 %    Lymphocytes % 27.5 21.0 - 52.0 %    Monocytes % 8.7 3.0 - 10.0 %    Eosinophils % 0.5 0.0 - 5.0 %    Basophils % 0.5 0.0 - 2.0 %    Immature Granulocytes % 0.1 0.0 - 0.5 %    Neutrophils Absolute 4.97 1.80 - 8.00 K/UL    Lymphocytes Absolute 2.18 0.90 - 3.60 K/UL    Monocytes Absolute 0.69 0.05 - 1.20 K/UL    Eosinophils Absolute 0.04 0.00 - 0.40 K/UL    Basophils Absolute 0.04 0.00 - 0.10 K/UL    Immature Granulocytes Absolute 0.01 0.00 - 0.04 K/UL    Differential Type AUTOMATED     Basic Metabolic Panel  Collection Time: 08/26/23  7:18 PM   Result Value Ref Range    Sodium 141 136 - 145 mmol/L    Potassium 3.9 3.5 - 5.5 mmol/L    Chloride 103 98 - 107 mmol/L    CO2 27 21 - 32 mmol/L    Anion Gap 11 7 - 16 mmol/L    Glucose 95 74 - 108 mg/dL    BUN 9 6 - 23 MG/DL    Creatinine 8.94 0.6 - 1.3 MG/DL    BUN/Creatinine Ratio 9      Est, Glom Filt Rate >90 >60 ml/min/1.75m2    Calcium 10.1 8.5 - 10.1 MG/DL   Troponin    Collection Time: 08/26/23  7:18 PM   Result Value Ref Range    Troponin T <6.0 0.0 - 22.0 ng/L      Labs Reviewed   CBC WITH AUTO DIFFERENTIAL   BASIC METABOLIC PANEL   TROPONIN       Radiologic Studies -   XR CHEST (2 VW)    (Results Pending)   CT HEAD WO CONTRAST    (Results Pending)   CT CERVICAL SPINE WO CONTRAST    (Results Pending)         The laboratory results, imaging results, and other  diagnostic exams were reviewed in the EMR.    Medical Decision Making   I am the first provider for this patient.    I reviewed the vital signs, available nursing notes, past medical history, past surgical history, family history and social history.    Vital Signs-Reviewed the patient's vital signs.    ED EKG interpretation:  Rhythm: sinus bradycardia; and regular . Rate (approx.): 57; Axis: normal; P wave: normal; QRS interval: normal ; ST/T wave: non-specific changes; Other findings: unchanged from previous ekg. This EKG was interpreted by Edsel Boers, PA-C         Records Reviewed: Personally, on initial evaluation  Chronic Conditions:  has no past medical history on file.       MDM:   DDX includes but is not limited to: head injury, syncope,     Patient overall well appearing, no distress. Alert, oriented, no neurologic deficit on examination. Will obtain lab work and imaging for further evaluation of patients complaint. Will continue to monitor and evaluate patient while in the ED.      Orders as below:  Orders Placed This Encounter   Procedures    XR CHEST (2 VW)    CT HEAD WO CONTRAST    CT CERVICAL SPINE WO CONTRAST    CBC with Auto Differential    Basic Metabolic Panel    Troponin    EKG 12 Lead          ED Course:   ED Course as of 08/26/23 1950   Wed Aug 26, 2023   1850 Initial assessment of patient complete.    Will order EKG, labs, chest x-ray, CT head/C-spine and reassess.    Pt declines pain medication at this time.  [DP]   1932 CBC without evidence of leukocytosis or anemia  [DP]   1943 Troponin negative [DP]   1943 Pt OTF to CT  [DP]   1948 BMP essentially unremarkable.  [DP]   1949 Pt back from CT.    No n/v since evaluation in the ED. Resting comfortably, no distress.  [DP]   1949 PROGRESS NOTE:  7:49 PM   Patient care will be transferred to Dr. Delores .  Discussed available  diagnostic results and care plan at length. Pending CT head and cervical spine results, reassessment for disposition.    Written by Edsel Boers, PA-C  [DP]      ED Course User Index  [DP] Boers Edsel, PA         Is this patient to be included in the SEP-1 core measure? No Exclusion criteria - the patient is NOT to be included for SEP-1 Core Measure due to: Infection is not suspected    Procedures:  Procedures    Rhythm interpretation from monitor: Sinus rhythm    Social Determinants of Health: none    Supplemental Historians include: family member     Documentation/Prior Results Review:  Old medical records.  Nursing notes.  Previous radiology studies.    Discussion of Mangement with other Physicians, QHP or Appropriate Source:  none         Diagnosis and Disposition     CLINICAL IMPRESSION:  1. Closed head injury, initial encounter    2. Nausea and vomiting, unspecified vomiting type         Medication List        START taking these medications      acetaminophen  500 MG tablet  Commonly known as: TYLENOL   Take 2 tablets by mouth every 6 hours as needed for Pain     ondansetron  4 MG tablet  Commonly known as: ZOFRAN   Take 1 tablet by mouth every 8 hours as needed for Nausea or Vomiting            ASK your doctor about these medications      albuterol  sulfate HFA 108 (90 Base) MCG/ACT inhaler  Commonly known as: Ventolin  HFA  Inhale 2 puffs into the lungs 4 times daily as needed for Wheezing or Shortness of Breath               Where to Get Your Medications        These medications were sent to [facility] [address] [place] - [address] [place] [phone] [address] [place] [phone]  [address] [place] [address]      Phone: [phone]   acetaminophen  500 MG tablet  ondansetron  4 MG tablet         Disposition: TBD     Patient condition at time of disposition: Stable            Dragon Disclaimer     Please note that this dictation was completed with Dragon, the computer voice recognition software.  Quite often unanticipated grammatical, syntax, homophones, and other interpretive errors are inadvertently transcribed by the  computer software.  Please disregard these errors.  Please excuse any errors that have escaped final proofreading.      [address], PA-C      Anthone Prieur, [place], [place]  08/27/23 [phone]

## 2023-08-26 NOTE — Discharge Instructions (Signed)
 Take medication as prescribed. Follow-up with your primary care physician within 2 days for reassessment. Bring the results from this visit with you for their review. Return to the ED immediately for any new, worsening, or persistent symptoms, including vomiting, dizziness, or any other medical concerns.

## 2023-08-26 NOTE — ED Triage Notes (Signed)
 Ambulatory to triage with c/o pain to right forehead, states fell in kitchen at 0100 striking head on wall, denies LOC I just feel like my equilibrium was off, I was not dizzy but I missed work today and they wanted me to get checked out before I come back to work.

## 2023-08-27 LAB — EKG 12-LEAD
Atrial Rate: 57 {beats}/min
P Axis: 16 degrees
P-R Interval: 182 ms
Q-T Interval: 362 ms
QRS Duration: 100 ms
QTc Calculation (Bazett): 352 ms
R Axis: -2 degrees
T Axis: -5 degrees
Ventricular Rate: 57 {beats}/min

## 2023-09-04 ENCOUNTER — Encounter: Payer: PRIVATE HEALTH INSURANCE | Attending: General Practice | Primary: General Practice

## 2024-01-06 ENCOUNTER — Emergency Department: Admit: 2024-01-06

## 2024-01-06 ENCOUNTER — Inpatient Hospital Stay
Admit: 2024-01-06 | Discharge: 2024-01-06 | Disposition: A | Discharge status: Home / Self Care | Arrived: VH | Attending: Emergency Medicine

## 2024-01-06 DIAGNOSIS — M25512 Pain in left shoulder: Principal | ICD-10-CM

## 2024-01-06 LAB — CBC WITH AUTO DIFFERENTIAL
Basophils: 0.2 % (ref 0–3)
Eosinophils: 0.6 % (ref 0–5)
Hematocrit: 39.9 % (ref 36.0–55.0)
Hemoglobin: 14 g/dL (ref 13.0–18.0)
Immature Granulocytes %: 0.2 % (ref 0.0–3.0)
Lymphocytes: 29.6 % (ref 28–48)
MCH: 30.2 pg (ref 23.0–34.6)
MCHC: 35.1 g/dL (ref 30.0–36.0)
MCV: 86 fL (ref 80.0–98.0)
MPV: 9.4 fL (ref 6.0–10.0)
Monocytes: 7.8 % (ref 1–13)
Neutrophils Segmented: 61.6 % (ref 34–64)
Nucleated RBCs: 0 (ref 0–0)
Platelets: 339 1000/mm3 (ref 140–450)
RBC: 4.64 M/uL (ref 3.80–5.70)
RDW: 38.2 (ref 35.1–43.9)
WBC: 8.3 1000/mm3 (ref 4.0–11.0)

## 2024-01-06 LAB — EKG 12-LEAD
Atrial Rate: 61 {beats}/min
Calculated P Axis: 35 degrees
Calculated R Axis: 2 degrees
Calculated T Axis: 1 degrees
P-R Interval: 184 ms
Q-T Interval: 370 ms
QRS Duration: 100 ms
QTC Calculation (Bezet): 372 ms
Ventricular Rate: 61 {beats}/min

## 2024-01-06 LAB — COMPREHENSIVE METABOLIC PANEL
ALT: 14 U/L (ref 10–49)
AST: 17 U/L (ref 0.0–33.9)
Albumin: 4.1 g/dL (ref 3.4–5.0)
Alkaline Phosphatase: 67 U/L (ref 46–116)
Anion Gap: 7 mmol/L (ref 5–15)
BUN: 6 mg/dL — ABNORMAL LOW (ref 9–23)
CO2: 29 meq/L (ref 20–31)
Calcium: 9.9 mg/dL (ref 8.7–10.4)
Chloride: 107 meq/L (ref 98–107)
Creatinine: 1.13 mg/dL (ref 0.70–1.30)
GFR African American: >60
GFR Non-African American: >60
Glucose: 92 mg/dL (ref 74–106)
Potassium: 3.5 meq/L (ref 3.5–5.1)
Sodium: 143 meq/L (ref 136–145)
Total Bilirubin: 0.4 mg/dL (ref 0.30–1.20)
Total Protein: 7.1 g/dL (ref 5.7–8.2)

## 2024-01-06 LAB — TROPONIN
Troponin, High Sensitivity: 8 ng/L (ref 0–53)
Troponin, High Sensitivity: 7 ng/L (ref 0–53)

## 2024-01-06 MED ORDER — KETOROLAC TROMETHAMINE 30 MG/ML IJ SOLN
30 | Freq: Once | INTRAMUSCULAR | Status: AC
Start: 2024-01-06 — End: 2024-01-06
  Administered 2024-01-06: 21:00:00 30 mg via INTRAVENOUS

## 2024-01-06 MED ORDER — METHOCARBAMOL 750 MG PO TABS
750 | ORAL_TABLET | Freq: Four times a day (QID) | ORAL | 0 refills | 10.00000 days | Status: AC
Start: 2024-01-06 — End: 2024-01-14

## 2024-01-06 MED ORDER — NAPROXEN 500 MG PO TABS
500 | ORAL_TABLET | Freq: Two times a day (BID) | ORAL | 0 refills | 15.00000 days | Status: AC | PRN
Start: 2024-01-06 — End: ?

## 2024-01-06 MED FILL — KETOROLAC TROMETHAMINE 30 MG/ML IJ SOLN: 30 mg/mL | INTRAMUSCULAR | Qty: 1 | Fill #0

## 2024-01-06 NOTE — ED Notes (Signed)
"  After triage pt stated he had an episode yesterday morning of chest pain prior to his arm hurting but that has since resolved  Orders placed     Nadyne Jeoffrey CROME, RN  01/06/24 1410    "

## 2024-01-06 NOTE — ED Triage Notes (Addendum)
"  Pt arrives ambulatory to triage with c/o L arm pain starting yesterday  Denies injury         No past medical history on file.   "

## 2024-01-06 NOTE — ED Provider Notes (Signed)
 "  [facility]  Emergency Department       Patient: Benjamin Cannon Age: 33 y.o. Sex: male    Date of Birth: 06/25/1990 Admit Date: 01/06/2024 PCP: No primary care provider on file.   MRN: [id number]  CSN: [id number]  ATTENDING: Maranda Evalene RAMAN, MD    Room: 109/EO09 Time Dictated: 4:23 PM APP: Vinie Fireman, PA-C     Chief Complaint   Chief Complaint   Patient presents with    Arm Pain       History of Present Illness   33 y.o. male with no past medical history presents to the emergency room complaining of left arm pain that started 2 days ago and is still present as well as an episode of chest pain along with 1 bout of diarrhea yesterday whenever they were in an argument with their divorce wife.  Patient states that they have been under a lot of stress and are going to divorce at this time.  They describe the pain as sharp and stabbing to the chest yesterday and has not had any since.  The pain to the left arm is sharp and stabbing with 10 out of 10 pain and starts at the top of the shoulder and goes down the left arm.  Patient states they have not taken any medication today.  They have also been having high blood pressure lately and was being seen at their primary care physician but they lost their health insurance have not been able to be seen since.     Review of Systems   See HPI  Review of Systems   Constitutional:  Negative for chills, diaphoresis, fatigue and fever.   HENT:  Negative for congestion, rhinorrhea, sinus pressure, sinus pain and sneezing.    Eyes:  Negative for pain, discharge and redness.   Respiratory:  Negative for cough, chest tightness, shortness of breath and wheezing.    Cardiovascular:  Positive for chest pain. Negative for palpitations and leg swelling.   Gastrointestinal:  Positive for diarrhea. Negative for abdominal pain, nausea and vomiting.   Musculoskeletal:  Positive for myalgias and neck pain. Negative for arthralgias, joint swelling and neck stiffness.   Skin:   Negative for color change.   Neurological:  Negative for dizziness, weakness, light-headedness, numbness and headaches.   Psychiatric/Behavioral:  Negative for behavioral problems.       Past Medical/Surgical History   No past medical history on file.  No past surgical history on file.    Social History     Social History     Socioeconomic History    Marital status: Single   Tobacco Use    Smoking status: Never    Smokeless tobacco: Never   Substance and Sexual Activity    Alcohol use: Yes    Drug use: Yes     Frequency: 1.0 times per week     Types: Marijuana Oda)    Sexual activity: Yes     Partners: Female     Social Drivers of Psychologist, [employer] Strain: Low Risk  (08/22/2022)    Overall Financial Resource Strain (CARDIA)     Difficulty of Paying Living Expenses: Not hard at all   Food Insecurity: No Food Insecurity (08/22/2022)    Hunger Vital Sign     Worried About Running Out of Food in the Last Year: Never true     Ran Out of Food in the Last Year: Never true   Transportation  Needs: Unknown (08/22/2022)    PRAPARE - Transportation     Lack of Transportation (Non-Medical): No   Physical Activity: Inactive (08/21/2022)    Exercise Vital Sign     Days of Exercise per Week: 0 days     Minutes of Exercise per Session: 0 min   Intimate Partner Violence: Not At Risk (01/15/2022)    Humiliation, Afraid, Rape, and Kick questionnaire     Fear of Current or Ex-Partner: No     Emotionally Abused: No     Physically Abused: No     Sexually Abused: No   Housing Stability: Unknown (08/22/2022)    Housing Stability Vital Sign     Unstable Housing in the Last Year: No       Family History   No family history on file.    Current Medications     Previous Medications    ACETAMINOPHEN  (TYLENOL ) 500 MG TABLET    Take 2 tablets by mouth every 6 hours as needed for Pain    ALBUTEROL  SULFATE HFA (VENTOLIN  HFA) 108 (90 BASE) MCG/ACT INHALER    Inhale 2 puffs into the lungs 4 times daily as needed for Wheezing or Shortness of  Breath    ONDANSETRON  (ZOFRAN ) 4 MG TABLET    Take 1 tablet by mouth every 8 hours as needed for Nausea or Vomiting         Allergies   Amoxicillin    Reviewed with patient  Physical Exam     ED Triage Vitals [01/06/24 1346]   BP Girls Systolic BP Percentile Girls Diastolic BP Percentile Boys Systolic BP Percentile Boys Diastolic BP Percentile Temp Temp Source Pulse   (!) 156/104 -- -- -- -- 99 F (37.2 C) Oral 71      Respirations SpO2 Height Weight - Scale       18 100 % 1.727 m (5' 8) 104.3 kg (230 lb)             Constitutional: Patient appears well developed and well nourished. Appearance and behavior are age and situation appropriate.  HEENT: Conjunctiva clear.  Mucous membranes moist, non-erythematous. Surface of the pharynx, palate, and tongue are pink, moist and without lesions.  Neck: supple, non tender, symmetrical, no masses or lymphadenopathy, no pain and tenderness noted on palpation of the right side of neck no pain or tenderness noted on the left side of the neck.  Pt is able to move the neck in flexion, extension, and lateral rotations with full movement.   Respiratory: lungs clear to auscultation without wheezing, rales, rhonci, nonlabored respirations. No tachypnea or accessory muscle use.  Cardiovascular: heart rate regular rate and rhythm without murmur rubs or gallops.     Gastrointestinal:  Soft, non-distended, normoactive bowel sounds. no pain or tenderness noted on palpitation of the abdomen in all four abdominal quadrants.   Musculoskeletal: Patient's point of tenderness on palpitation of the left shoulder at the top of the deltoid.  Bilateral grip strength noted with no weakness.  Patient is able to raise the left arm above the head and passive movement.  Patient is also able to perform empty can test with no issues.  No pain or tenderness or step-offs or deformities noted palpitation of the cervical, thoracic, lumbar, or sacral spines.  Pulses:  radial and DP pulses 2+ and equal  bilaterally.   Integumentary: warm and dry, no jaundice, no rashes or lesions  Neurologic: alert and oriented, no focal weakness. CNII-IV exam normal.  Impression and Management Plan   33 y.o. male with left arm pain that started 2 days ago and is still present as well as an episode of chest pain along with 1 bout of diarrhea yesterday whenever they were in an argument with their divorce wife. Will order CMP, CBC, troponin, show EKG, chest x-ray, and IV.  Due to this possibly being muscular in nature will order Toradol .     DDx: Including but not limited to left shoulder strain, cervical radiculopathy, atypical chest pain, hypertension, electrolyte abnormality  Diagnostic Studies     Results for orders placed or performed during the hospital encounter of 01/06/24   XR CHEST (2 VW)    Narrative    Indication: Pain     TECHNIQUE: 2 Views and 2 Exposures    COMPARISON: 04/14/2023    Findings:  Cardiomediastinal Silhouette is within normal limits. No consolidative airspace  opacity. No pleural effusions or pneumothorax. No acute osseous abnormalities.         Impression    Impression:  1. No acute cardiopulmonary process.    Electronically signed by: Deward Jumper, MD 01/06/2024 2:37 PM EST            Workstation ID: [id number]     Comprehensive Metabolic Panel   Result Value Ref Range    Potassium 3.5 3.5 - 5.1 mEq/L    Chloride 107 98 - 107 mEq/L    Sodium 143 136 - 145 mEq/L    CO2 29 20 - 31 mEq/L    Glucose 92 74 - 106 mg/dl    BUN 6 (L) 9 - 23 mg/dl    Creatinine 8.86 9.29 - 1.30 mg/dl    GFR African American >60      GFR Non-African American >60      Calcium 9.9 8.7 - 10.4 mg/dl    Anion Gap 7 5 - 15 mmol/L    AST 17.0 0.0 - 33.9 U/L    ALT 14 10 - 49 U/L    Alkaline Phosphatase 67 46 - 116 U/L    Total Bilirubin 0.40 0.30 - 1.20 mg/dl    Total Protein 7.1 5.7 - 8.2 gm/dl    Albumin 4.1 3.4 - 5.0 gm/dl   CBC with Auto Differential   Result Value Ref Range    WBC 8.3 4.0 - 11.0 1000/mm3    RBC 4.64 3.80 - 5.70  M/uL    Hemoglobin 14.0 13.0 - 18.0 gm/dl    Hematocrit 60.0 63.9 - 55.0 %    MCV 86.0 80.0 - 98.0 fL    MCH 30.2 23.0 - 34.6 pg    MCHC 35.1 30.0 - 36.0 gm/dl    Platelets 660 859 - 450 1000/mm3    MPV 9.4 6.0 - 10.0 fL    RDW 38.2 35.1 - 43.9      Nucleated RBCs 0 0 - 0      Immature Granulocytes % 0.2 0.0 - 3.0 %    Neutrophils Segmented 61.6 34 - 64 %    Lymphocytes 29.6 28 - 48 %    Monocytes 7.8 1 - 13 %    Eosinophils 0.6 0 - 5 %    Basophils 0.2 0 - 3 %   Troponin   Result Value Ref Range    Troponin, High Sensitivity 8 0 - 53 ng/L   Troponin   Result Value Ref Range    Troponin, High Sensitivity 7 0 - 53 ng/L   EKG 12 Lead  Result Value Ref Range    Ventricular Rate 61 BPM    Atrial Rate 61 BPM    P-R Interval 184 ms    QRS Duration 100 ms    Q-T Interval 370 ms    QTC Calculation (Bezet) 372 ms    Calculated P Axis 35 degrees    Calculated R Axis 2 degrees    Calculated T Axis 1 degrees    DIAGNOSIS, 93000       Sinus rhythm with marked sinus arrhythmia  Nonspecific T wave abnormality  Abnormal ECG  No previous ECGs available  Confirmed by Coleman, Mohsin ([id number]) on 01/06/2024 2:57:33 PM       ECG: Interpreted by myself and Maranda Evalene RAMAN, MD. My interpretation is sinus arrhythmia noted at a rate of 61 with no elevation or depression noted per my interpretation.    Imaging studies independently interpreted by myself and Maranda Evalene RAMAN, MD. My interpretation of the chest x-ray noted: No pneumothorax, pleural effusion, or consolidation per my interpretation.  ED Course/Medical Decision Making   33 y.o. male with left arm pain that started 2 days ago and is still present as well as an episode of chest pain along with 1 bout of diarrhea yesterday whenever they were in an argument with their divorce wife.     Exam: While in the ER exam noted from above.    Labs: CBC noted no white count, hemoglobin and hematocrit within normal limits, and platelet count within normal limits. ,CMP noted sodium, potassium,  chloride, carbon dioxide, creatinine, glucose, calcium to be within normal limits. BUN decreased at 6. LFT noted albumin, alkaline phosphatase, ALT, AST, total bilirubin, direct bilirubin to be within normal limits.  Troponin noted to be 8 and decreased to 7 and is flat.  Patient was concerned about there blood pressure and it was rechecked and on recheck is noted to be 138/82.  Patient was advised of the blood pressure feels a lot better knowing that it is back to normal.    Imaging: EKG noted shows sinus arrhythmia at a rate of 61 no elevation or depression acute possibly due to patient's breathing pattern.  Chest x-ray is negative with no abnormalities.    ER medications: Due to nature patient driving patient was given Toradol .  On reassessment after patient received Toradol  they advised that the pain is gone and that they have had no chest pain during the whole ER visit.    Medications prescribed: Patient is prescribed naproxen  and Robaxin  due to their pain being muscular in nature.     Patient advised to take the medication as prescribed.  Take Tylenol  or Motrin  for pain. When taking the naproxen  do not take any ibuprofen . When taking the robaxin  do not drive or operate heavy machinery or drink alcohol because it will make you drowsy. Follow up with transitional care clinic due to ER visit. return to the ER if you are having any worsening left arm pain, left shoulder pain, unable to move the left arm or left shoulder, fever, headache, lightheadedness, dizziness, neck pain, blurry vision, visual disturbances, sinus pain or pressure, cough, congestion, chest pain, chest discomfort, shortness of breath, abdominal pain, nausea, vomiting, burning or painful urination, or diarrhea.  Pt is explained all instructions and agrees to the plan. Pt is stable for discharge.         RECORDS REVIEWED:  I reviewed the patient's previous records here at [facility] and available outside facilities and note from hospital encounter  on  08/26/2023 patient was seen for nausea and head injury.    EXTERNAL RESULTS REVIEWED: Cervical spine CT without contrast from 08/26/2023 noted     FINDINGS:     Skull base: Normal. No fractures.     Craniocervical junction: No fracture. Normal relationship maintained.     Subaxial spine: No fracture. Normal vertebral body heights and cervical  alignment.     Spinal canal: Normal.     Soft tissues: Normal.     Lung apices: Clear.        IMPRESSION:     No evidence for acute fracture or traumatic malalignment of the cervical spine.    INDEPENDENT HISTORIAN:  History and/or plan development assisted by: Self    Severe exacerbation or progression of chronic illness: Stress      Threat to body function without evaluation and management: Musculoskeletal, neurological, cardiovascular      SOCIAL DETERMINANTS  impacting Evaluation and Management: Provider availability, stress, health literacy: Patient does not have a PCP      Comorbidities impacting Evaluation and Management: None    Medications   ketorolac  (TORADOL ) injection 30 mg (30 mg IntraVENous Given 01/06/24 1555)     Final Diagnosis     1. Left arm pain    2. Acute pain of left shoulder        Disposition   Discharge home and follow-up with transitional care clinic    Vinie Fireman, PA-C  January 06, 2024  4:23 PM     I hereby certify this patient for admission based upon medical necessity as noted above.    The patient was personally evaluated by myself and discussed with Maranda Evalene RAMAN, MD who agrees with the above assessment and plan.    My signature above authenticates this document and my orders, the final diagnosis (es), discharge prescription (s), and instructions in the Epic record. If you have any questions please contact [phone].     Nursing notes have been reviewed by the physician/ advanced practice clinician.    Dragon medical dictation software was used for portions of this report. Unintended voice recognition errors may  occur.                           Fireman Vinie NOVAK, [place]  01/06/24 1649    "

## 2024-01-06 NOTE — Discharge Instructions (Signed)
"  Patient advised to take the medication as prescribed.  Take Tylenol  or Motrin  for pain. When taking the naproxen  do not take any ibuprofen . When taking the robaxin  do not drive or operate heavy machinery or drink alcohol because it will make you drowsy. Follow up with transitional care clinic due to ER visit. return to the ER if you are having any worsening left arm pain, left shoulder pain, unable to move the left arm or left shoulder, fever, headache, lightheadedness, dizziness, neck pain, blurry vision, visual disturbances, sinus pain or pressure, cough, congestion, chest pain, chest discomfort, shortness of breath, abdominal pain, nausea, vomiting, burning or painful urination, or diarrhea.     "

## 2024-01-06 NOTE — ED Notes (Signed)
"  Pt ambulated from waiting room to OBS 9 with no difficulty. Pain at 5/10 per pt dull ache since approximately 5pm on the day prior. Pt states he was having an argument when the pain started.      Gretel Jayson DEL, RN  01/06/24 1513    "
